# Patient Record
Sex: Female | Born: 1995 | Race: Black or African American | Hispanic: Yes | Marital: Single | Smoking: Former smoker
Health system: Southern US, Community
[De-identification: ages and names within clinical notes are randomized; demographics above are authoritative.]

## PROBLEM LIST (undated history)

## (undated) ENCOUNTER — Inpatient Hospital Stay (HOSPITAL_COMMUNITY): Payer: Self-pay

## (undated) DIAGNOSIS — B009 Herpesviral infection, unspecified: Secondary | ICD-10-CM

## (undated) DIAGNOSIS — O24419 Gestational diabetes mellitus in pregnancy, unspecified control: Secondary | ICD-10-CM

## (undated) HISTORY — PX: NO PAST SURGERIES: SHX2092

---
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
## 2017-02-23 DIAGNOSIS — B009 Herpesviral infection, unspecified: Secondary | ICD-10-CM

## 2017-02-23 HISTORY — DX: Herpesviral infection, unspecified: B00.9

## 2017-09-04 ENCOUNTER — Encounter (HOSPITAL_COMMUNITY): Payer: Self-pay

## 2017-09-04 ENCOUNTER — Emergency Department (HOSPITAL_COMMUNITY)
Admission: EM | Admit: 2017-09-04 | Discharge: 2017-09-04 | Disposition: A | Discharge status: Home / Self Care | Payer: Medicaid Other | Attending: Emergency Medicine | Admitting: Emergency Medicine

## 2017-09-04 DIAGNOSIS — Z3201 Encounter for pregnancy test, result positive: Secondary | ICD-10-CM | POA: Insufficient documentation

## 2017-09-04 DIAGNOSIS — Z3490 Encounter for supervision of normal pregnancy, unspecified, unspecified trimester: Secondary | ICD-10-CM

## 2017-09-04 DIAGNOSIS — Z79899 Other long term (current) drug therapy: Secondary | ICD-10-CM | POA: Insufficient documentation

## 2017-09-04 LAB — I-STAT BETA HCG BLOOD, ED (MC, WL, AP ONLY): I-stat hCG, quantitative: >2000 m[IU]/mL — ABNORMAL HIGH (ref <5)

## 2017-09-04 NOTE — ED Triage Notes (Signed)
Requesting pregnancy test.

## 2017-09-04 NOTE — ED Provider Notes (Signed)
[facility] EMERGENCY DEPARTMENT Provider Note   CSN: [id number] Arrival date & time: 09/04/17  1811     History   Chief Complaint Chief Complaint  Patient presents with  . Possible Pregnancy    HPI Kayla Liu is a 22 y.o. female.  22 year old female presents with request of pregnancy test.  Patient states she took a pregnancy test today that was positive and she is here to confirm this.  States her last menstrual cycle was May 25, denies any bleeding or pain.  No other complaints or concerns.     History reviewed. No pertinent past medical history.  There are no active problems to display for this patient.   History reviewed. No pertinent surgical history.   OB History   None      Home Medications    Prior to Admission medications   Medication Sig Start Date End Date Taking? Authorizing Provider  acetaminophen (TYLENOL) 500 MG tablet Take 1 tablet (500 mg total) by mouth every 6 (six) hours as needed. 12/09/16   Renita Papa, PA-C    Family History History reviewed. No pertinent family history.  Social History Social History   Tobacco Use  . Smoking status: Never Smoker  . Smokeless tobacco: Never Used  Substance Use Topics  . Alcohol use: No  . Drug use: No     Allergies   Patient has no known allergies.   Review of Systems Review of Systems  Constitutional: Negative for fever.  Gastrointestinal: Negative for abdominal pain.  Genitourinary: Negative for vaginal bleeding and vaginal discharge.  Psychiatric/Behavioral: Negative for confusion.  All other systems reviewed and are negative.    Physical Exam Updated Vital Signs BP (!) 106/50   Pulse (!) 101   Temp 98.4 F (36.9 C) (Oral)   Resp 16   LMP 07/05/2017   SpO2 100%   Physical Exam  Constitutional: She is oriented to person, place, and time. She appears well-developed and well-nourished. No distress.  HENT:  Head: Normocephalic and atraumatic.    Pulmonary/Chest: Effort normal.  Neurological: She is alert and oriented to person, place, and time.  Skin: She is not diaphoretic.  Psychiatric: She has a normal mood and affect. Her behavior is normal.  Nursing note and vitals reviewed.    ED Treatments / Results  Labs (all labs ordered are listed, but only abnormal results are displayed) Labs Reviewed  I-STAT BETA HCG BLOOD, ED ([facility], [facility], [facility] ONLY) - Abnormal; Notable for the following components:      Result Value   I-stat hCG, quantitative >2,000.0 (*)    All other components within normal limits    EKG None  Radiology No results found.  Procedures Procedures (including critical care time)  Medications Ordered in ED Medications - No data to display   Initial Impression / Assessment and Plan / ED Course  I have reviewed the triage vital signs and the nursing notes.  Pertinent labs & imaging results that were available during my care of the patient were reviewed by me and considered in my medical decision making (see chart for details).  Clinical Course as of Sep 05 1854  Sat Sep 04, 6472  4134 22 year old female in the emergency room for a pregnancy test, no other complaints.  Patient's pregnancy test is positive, advised her to follow-up with her PCP.  Given information on first trimester care.   [LM]    Clinical Course User Index [LM] Tacy Learn, PA-C  Final Clinical Impressions(s) / ED Diagnoses   Final diagnoses:  Pregnancy, unspecified gestational age    ED Discharge Orders    None       Roque Lias 09/04/17 1856    Carmin Muskrat, MD 09/04/17 2351

## 2017-09-04 NOTE — Discharge Instructions (Addendum)
Take a daily prenatal vitamin, these are available at any pharmacy. Call your primary care doctor for further evaluation and follow-up.

## 2017-09-04 NOTE — ED Notes (Signed)
Pt states that she is here for a pregnancy test, denies any pain. States her last period was in May and has had positive home pregnancy test.

## 2017-10-19 DIAGNOSIS — Z3490 Encounter for supervision of normal pregnancy, unspecified, unspecified trimester: Secondary | ICD-10-CM | POA: Insufficient documentation

## 2017-10-20 ENCOUNTER — Other Ambulatory Visit (HOSPITAL_COMMUNITY)
Admission: RE | Admit: 2017-10-20 | Discharge: 2017-10-20 | Disposition: A | Discharge status: Home / Self Care | Payer: Medicaid Other | Source: Ambulatory Visit | Attending: Obstetrics and Gynecology | Admitting: Obstetrics and Gynecology

## 2017-10-20 ENCOUNTER — Encounter: Payer: Self-pay | Admitting: Obstetrics and Gynecology

## 2017-10-20 ENCOUNTER — Ambulatory Visit (INDEPENDENT_AMBULATORY_CARE_PROVIDER_SITE_OTHER): Payer: Medicaid Other | Admitting: Obstetrics and Gynecology

## 2017-10-20 VITALS — BP 123/72 | HR 89 | Wt 145.3 lb

## 2017-10-20 DIAGNOSIS — Z23 Encounter for immunization: Secondary | ICD-10-CM | POA: Diagnosis not present

## 2017-10-20 DIAGNOSIS — Z3490 Encounter for supervision of normal pregnancy, unspecified, unspecified trimester: Secondary | ICD-10-CM

## 2017-10-20 DIAGNOSIS — Z3492 Encounter for supervision of normal pregnancy, unspecified, second trimester: Secondary | ICD-10-CM | POA: Insufficient documentation

## 2017-10-20 DIAGNOSIS — Z3402 Encounter for supervision of normal first pregnancy, second trimester: Secondary | ICD-10-CM

## 2017-10-20 NOTE — Progress Notes (Signed)
Subjective:  Kayla Liu is a 22 y.o. G1P0 at [redacted]w[redacted]d being seen today for her first OB visit. EDD by certain LMP. No chronic medical problems or medications.  She is currently monitored for the following issues for this low-risk pregnancy and has Supervision of normal pregnancy, antepartum on their problem list.  Patient reports no complaints.  Contractions: Irritability. Vag. Bleeding: None.   . Denies leaking of fluid.   The following portions of the patient's history were reviewed and updated as appropriate: allergies, current medications, past family history, past medical history, past social history, past surgical history and problem list. Problem list updated.  Objective:   Vitals:   10/20/17 1428  BP: 123/72  Pulse: 89  Weight: 145 lb 4.8 oz (65.9 kg)    Fetal Status: Fetal Heart Rate (bpm): 168         General:  Alert, oriented and cooperative. Patient is in no acute distress.  Skin: Skin is warm and dry. No rash noted.   Cardiovascular: Normal heart rate noted  Respiratory: Normal respiratory effort, no problems with respiration noted  Abdomen: Soft, gravid, appropriate for gestational age. Pain/Pressure: Absent     Pelvic:  Cervical exam performed        Extremities: Normal range of motion.  Edema: None  Mental Status: Normal mood and affect. Normal behavior. Normal judgment and thought content.   Urinalysis:      Assessment and Plan:  Pregnancy: G1P0 at [redacted]w[redacted]d  1. Encounter for supervision of normal pregnancy, antepartum, unspecified gravidity Prenatal care and labs reviewed with pt. Flu vaccine today Discussed Panorama, undecided presently  Preterm labor symptoms and general obstetric precautions including but not limited to vaginal bleeding, contractions, leaking of fluid and fetal movement were reviewed in detail with the patient. Please refer to After Visit Summary for other counseling recommendations.  Return in about 4 weeks (around 11/17/2017) for OB  visit.   Chancy Milroy, MD

## 2017-10-20 NOTE — Progress Notes (Signed)
Pt is here for initial OB visit. LMP 07/05/17. EDD 04/11/2018.

## 2017-10-20 NOTE — Patient Instructions (Signed)

## 2017-10-22 LAB — URINE CULTURE, OB REFLEX

## 2017-10-22 LAB — CERVICOVAGINAL ANCILLARY ONLY
CHLAMYDIA, DNA PROBE: NEGATIVE
Neisseria Gonorrhea: NEGATIVE

## 2017-10-22 LAB — CULTURE, OB URINE

## 2017-10-23 ENCOUNTER — Encounter (HOSPITAL_COMMUNITY): Payer: Self-pay | Admitting: Emergency Medicine

## 2017-10-23 ENCOUNTER — Emergency Department (HOSPITAL_COMMUNITY)
Admission: EM | Admit: 2017-10-23 | Discharge: 2017-10-24 | Discharge status: Against Medical Advice | Payer: Medicaid Other | Attending: Emergency Medicine | Admitting: Emergency Medicine

## 2017-10-23 DIAGNOSIS — Z5321 Procedure and treatment not carried out due to patient leaving prior to being seen by health care provider: Secondary | ICD-10-CM | POA: Diagnosis not present

## 2017-10-23 DIAGNOSIS — R109 Unspecified abdominal pain: Secondary | ICD-10-CM | POA: Diagnosis not present

## 2017-10-23 DIAGNOSIS — Z3A15 15 weeks gestation of pregnancy: Secondary | ICD-10-CM | POA: Diagnosis not present

## 2017-10-23 DIAGNOSIS — O9989 Other specified diseases and conditions complicating pregnancy, childbirth and the puerperium: Secondary | ICD-10-CM | POA: Diagnosis present

## 2017-10-23 LAB — COMPREHENSIVE METABOLIC PANEL
ALBUMIN: 3.9 g/dL (ref 3.5–5.0)
ALT: 30 U/L (ref 0–44)
AST: 31 U/L (ref 15–41)
Alkaline Phosphatase: 73 U/L (ref 38–126)
Anion gap: 9 (ref 5–15)
BUN: 9 mg/dL (ref 6–20)
CHLORIDE: 100 mmol/L (ref 98–111)
CO2: 23 mmol/L (ref 22–32)
CREATININE: 0.6 mg/dL (ref 0.44–1.00)
Calcium: 9.8 mg/dL (ref 8.9–10.3)
GFR calc Af Amer: >60 mL/min (ref >60)
GFR calc non Af Amer: >60 mL/min (ref >60)
GLUCOSE: 95 mg/dL (ref 70–99)
POTASSIUM: 3.8 mmol/L (ref 3.5–5.1)
Sodium: 132 mmol/L — ABNORMAL LOW (ref 135–145)
Total Bilirubin: 0.6 mg/dL (ref 0.3–1.2)
Total Protein: 7.5 g/dL (ref 6.5–8.1)

## 2017-10-23 LAB — CBC
HEMATOCRIT: 35.2 % — AB (ref 36.0–46.0)
Hemoglobin: 12 g/dL (ref 12.0–15.0)
MCH: 30.9 pg (ref 26.0–34.0)
MCHC: 34.1 g/dL (ref 30.0–36.0)
MCV: 90.7 fL (ref 78.0–100.0)
PLATELETS: 331 10*3/uL (ref 150–400)
RBC: 3.88 MIL/uL (ref 3.87–5.11)
RDW: 12.9 % (ref 11.5–15.5)
WBC: 7.7 10*3/uL (ref 4.0–10.5)

## 2017-10-23 LAB — URINALYSIS, ROUTINE W REFLEX MICROSCOPIC
BILIRUBIN URINE: NEGATIVE
Glucose, UA: NEGATIVE mg/dL
Ketones, ur: NEGATIVE mg/dL
LEUKOCYTES UA: NEGATIVE
Nitrite: NEGATIVE
PH: 6 (ref 5.0–8.0)
Protein, ur: NEGATIVE mg/dL
SPECIFIC GRAVITY, URINE: 1.024 (ref 1.005–1.030)

## 2017-10-23 LAB — LIPASE, BLOOD: LIPASE: 29 U/L (ref 11–51)

## 2017-10-23 LAB — HCG, QUANTITATIVE, PREGNANCY: HCG, BETA CHAIN, QUANT, S: 151262 m[IU]/mL — AB (ref <5)

## 2017-10-23 NOTE — ED Triage Notes (Signed)
Pt is [redacted] weeks pregnant and began to experience left sided abdominal pain that she describes as a stabbing and crushing pain.  Denies any other symptoms, hurts when she moves.  No apparent injuries

## 2017-10-24 NOTE — ED Notes (Signed)
Pt called for vitals signs recheck in waiting room no answer

## 2017-10-24 NOTE — ED Notes (Signed)
Called pt x2 for room, no response. 

## 2017-10-29 LAB — OBSTETRIC PANEL, INCLUDING HIV
Antibody Screen: NEGATIVE
Basophils Absolute: 0 10*3/uL (ref 0.0–0.2)
Basos: 1 %
EOS (ABSOLUTE): 0 10*3/uL (ref 0.0–0.4)
EOS: 0 %
HEMOGLOBIN: 12 g/dL (ref 11.1–15.9)
HEP B S AG: NEGATIVE
HIV Screen 4th Generation wRfx: NONREACTIVE
Hematocrit: 35.1 % (ref 34.0–46.6)
IMMATURE GRANS (ABS): 0 10*3/uL (ref 0.0–0.1)
IMMATURE GRANULOCYTES: 0 %
LYMPHS ABS: 2 10*3/uL (ref 0.7–3.1)
LYMPHS: 37 %
MCH: 30.3 pg (ref 26.6–33.0)
MCHC: 34.2 g/dL (ref 31.5–35.7)
MCV: 89 fL (ref 79–97)
Monocytes Absolute: 0.6 10*3/uL (ref 0.1–0.9)
Monocytes: 10 %
NEUTROS PCT: 52 %
Neutrophils Absolute: 2.9 10*3/uL (ref 1.4–7.0)
Platelets: 354 10*3/uL (ref 150–450)
RBC: 3.96 x10E6/uL (ref 3.77–5.28)
RDW: 13.1 % (ref 12.3–15.4)
RH TYPE: POSITIVE
RPR: NONREACTIVE
RUBELLA: 2.24 {index} (ref >0.99)
WBC: 5.5 10*3/uL (ref 3.4–10.8)

## 2017-10-29 LAB — HEMOGLOBINOPATHY EVALUATION
HEMOGLOBIN A2 QUANTITATION: 2.5 % (ref 1.8–3.2)
HGB C: 0 %
HGB S: 0 %
HGB VARIANT: 0 %
Hemoglobin F Quantitation: 0.9 % (ref 0.0–2.0)
Hgb A: 96.6 % (ref 96.4–98.8)

## 2017-10-29 LAB — CYSTIC FIBROSIS MUTATION 97: Interpretation: NOT DETECTED

## 2017-11-17 ENCOUNTER — Encounter: Payer: Medicaid Other | Admitting: Obstetrics and Gynecology

## 2017-11-22 ENCOUNTER — Encounter: Payer: Self-pay | Admitting: Obstetrics

## 2017-11-22 ENCOUNTER — Ambulatory Visit (INDEPENDENT_AMBULATORY_CARE_PROVIDER_SITE_OTHER): Payer: Medicaid Other | Admitting: Obstetrics and Gynecology

## 2017-11-22 ENCOUNTER — Encounter: Payer: Self-pay | Admitting: Obstetrics and Gynecology

## 2017-11-22 DIAGNOSIS — Z3490 Encounter for supervision of normal pregnancy, unspecified, unspecified trimester: Secondary | ICD-10-CM

## 2017-11-22 DIAGNOSIS — Z3481 Encounter for supervision of other normal pregnancy, first trimester: Secondary | ICD-10-CM | POA: Diagnosis not present

## 2017-11-22 NOTE — Progress Notes (Signed)
   PRENATAL VISIT NOTE  Subjective:  Kayla Liu is a 22 y.o. G1P0 at [redacted]w[redacted]d being seen today for ongoing prenatal care.  She is currently monitored for the following issues for this low-risk pregnancy and has Supervision of normal pregnancy, antepartum on their problem list.  Patient reports no complaints.  Contractions: Not present. Vag. Bleeding: None.  Movement: Present. Denies leaking of fluid.   The following portions of the patient's history were reviewed and updated as appropriate: allergies, current medications, past family history, past medical history, past social history, past surgical history and problem list. Problem list updated.  Objective:   Vitals:   11/22/17 1608  BP: 124/80  Pulse: 98  Weight: 142 lb 6.4 oz (64.6 kg)    Fetal Status: Fetal Heart Rate (bpm): 145   Movement: Present     General:  Alert, oriented and cooperative. Patient is in no acute distress.  Skin: Skin is warm and dry. No rash noted.   Cardiovascular: Normal heart rate noted  Respiratory: Normal respiratory effort, no problems with respiration noted  Abdomen: Soft, gravid, appropriate for gestational age.  Pain/Pressure: Absent     Pelvic: Cervical exam deferred        Extremities: Normal range of motion.  Edema: None  Mental Status: Normal mood and affect. Normal behavior. Normal judgment and thought content.   Assessment and Plan:  Pregnancy: G1P0 at [redacted]w[redacted]d  1. Encounter for supervision of normal pregnancy, antepartum, unspecified gravidity Patient is doing well without complaints Patient awaiting pregnancy medicaid Agrees to genetic screening today Discussed the need to schedule anatomy ultrasound (offered ultrasound with health department) - AFP, Serum, Open Spina Bifida - Genetic Screening  Preterm labor symptoms and general obstetric precautions including but not limited to vaginal bleeding, contractions, leaking of fluid and fetal movement were reviewed in detail with the  patient. Please refer to After Visit Summary for other counseling recommendations.  Return in about 4 weeks (around 12/20/2017) for ROB.  No future appointments.  Mora Bellman, MD

## 2017-11-22 NOTE — Progress Notes (Signed)
Pt is here for ROB G1P0 20w.

## 2017-11-24 LAB — AFP, SERUM, OPEN SPINA BIFIDA
AFP MOM: 1.02
AFP VALUE AFPOSL: 63.9 ng/mL
Gest. Age on Collection Date: 20 weeks
MATERNAL AGE AT EDD: 20.2 a
OSBR RISK 1 IN: 10000
TEST RESULTS AFP: NEGATIVE
WEIGHT: 142 [lb_av]

## 2017-11-25 ENCOUNTER — Encounter (HOSPITAL_COMMUNITY): Payer: Self-pay | Admitting: *Deleted

## 2017-11-25 ENCOUNTER — Inpatient Hospital Stay (HOSPITAL_COMMUNITY)
Admission: AD | Admit: 2017-11-25 | Discharge: 2017-11-25 | Disposition: A | Discharge status: Home / Self Care | Payer: Medicaid Other | Source: Ambulatory Visit | Attending: Obstetrics & Gynecology | Admitting: Obstetrics & Gynecology

## 2017-11-25 DIAGNOSIS — B373 Candidiasis of vulva and vagina: Secondary | ICD-10-CM | POA: Insufficient documentation

## 2017-11-25 DIAGNOSIS — Z8249 Family history of ischemic heart disease and other diseases of the circulatory system: Secondary | ICD-10-CM | POA: Diagnosis not present

## 2017-11-25 DIAGNOSIS — O98812 Other maternal infectious and parasitic diseases complicating pregnancy, second trimester: Secondary | ICD-10-CM | POA: Insufficient documentation

## 2017-11-25 DIAGNOSIS — Z79899 Other long term (current) drug therapy: Secondary | ICD-10-CM | POA: Diagnosis not present

## 2017-11-25 DIAGNOSIS — Z3A20 20 weeks gestation of pregnancy: Secondary | ICD-10-CM | POA: Diagnosis not present

## 2017-11-25 DIAGNOSIS — O4692 Antepartum hemorrhage, unspecified, second trimester: Secondary | ICD-10-CM | POA: Diagnosis present

## 2017-11-25 DIAGNOSIS — B3731 Acute candidiasis of vulva and vagina: Secondary | ICD-10-CM

## 2017-11-25 LAB — URINALYSIS, ROUTINE W REFLEX MICROSCOPIC
Bilirubin Urine: NEGATIVE
GLUCOSE, UA: NEGATIVE mg/dL
Hgb urine dipstick: NEGATIVE
KETONES UR: NEGATIVE mg/dL
Nitrite: NEGATIVE
Protein, ur: NEGATIVE mg/dL
Specific Gravity, Urine: 1.025 (ref 1.005–1.030)
pH: 5 (ref 5.0–8.0)

## 2017-11-25 LAB — WET PREP, GENITAL
Sperm: NONE SEEN
Trich, Wet Prep: NONE SEEN
Yeast Wet Prep HPF POC: NONE SEEN

## 2017-11-25 MED ORDER — TERCONAZOLE 0.4 % VA CREA: 1.0000 | TOPICAL_CREAM | Freq: Every day | VAGINAL | 0 refills | Status: DC

## 2017-11-25 NOTE — MAU Note (Signed)
Pt reports she had blood on tissue when she wiped earlier, some vaginal irritation.

## 2017-11-25 NOTE — MAU Provider Note (Signed)
History     CSN: [id number]  Arrival date and time: 11/25/17 1816   First Provider Initiated Contact with Patient 11/25/17 1855      Chief Complaint  Patient presents with  . Vaginal Bleeding  . vaginal irritation   Kayla Liu is a 22 y.o. G1P0 at [redacted]w[redacted]d who presents today with spotting, and vaginal irritation. Vaginal Bleeding  The patient's primary symptoms include genital itching and vaginal discharge. The patient's pertinent negatives include no pelvic pain. This is a new problem. The current episode started in the past 7 days. The problem occurs constantly. The problem has been unchanged. The patient is experiencing no pain. She is pregnant. Pertinent negatives include no chills, dysuria, fever, frequency, nausea, urgency or vomiting. The vaginal discharge was white. The vaginal bleeding is spotting (occasionally with wiping today ). She has not been passing clots. She has not been passing tissue. Nothing aggravates the symptoms. She has tried nothing for the symptoms. She is sexually active. Menstrual history: lmp 07/05/17     OB History    Gravida  1   Para      Term      Preterm      AB      Living        SAB      TAB      Ectopic      Multiple      Live Births              Past Medical History:  Diagnosis Date  . Medical history non-contributory     Past Surgical History:  Procedure Laterality Date  . NO PAST SURGERIES      Family History  Problem Relation Age of Onset  . Hypertension Maternal Aunt     Social History   Tobacco Use  . Smoking status: Never Smoker  . Smokeless tobacco: Never Used  Substance Use Topics  . Alcohol use: No  . Drug use: No    Allergies: No Known Allergies  Medications Prior to Admission  Medication Sig Dispense Refill Last Dose  . acetaminophen (TYLENOL) 500 MG tablet Take 1 tablet (500 mg total) by mouth every 6 (six) hours as needed. (Patient not taking: Reported on 11/22/2017) 30 tablet 0 Not  Taking  . Prenatal Vit-Fe Fumarate-FA (PRENATAL MULTIVITAMIN) TABS tablet Take 1 tablet by mouth daily at 12 noon.   Taking    Review of Systems  Constitutional: Negative for chills and fever.  Gastrointestinal: Negative for nausea and vomiting.  Genitourinary: Positive for vaginal bleeding and vaginal discharge. Negative for dysuria, frequency, pelvic pain and urgency.   Physical Exam   Blood pressure (!) 119/54, pulse 100, temperature 98.5 F (36.9 C), temperature source Oral, resp. rate 16, height 4\' 10"  (1.473 m), weight 64 kg, last menstrual period 07/05/2017, SpO2 100 %.  Physical Exam  Nursing note and vitals reviewed. Constitutional: She is oriented to person, place, and time. She appears well-developed and well-nourished. No distress.  HENT:  Head: Normocephalic.  Cardiovascular: Normal rate.  Respiratory: Effort normal.  GI: Soft. There is no tenderness. There is no rebound.  Genitourinary:  Genitourinary Comments:  External: no lesion Vagina: moderate amount of thick white clumpy discharge  Cervix: pink, smooth, some contact bleeding, closed/thick  Uterus: AGA   Neurological: She is alert and oriented to person, place, and time.  Skin: Skin is warm and dry.  Psychiatric: She has a normal mood and affect.  +FHT 157 with doppler  Results for orders placed or performed during the hospital encounter of 11/25/17 (from the past 24 hour(s))  Urinalysis, Routine w reflex microscopic     Status: Abnormal   Collection Time: 11/25/17  7:01 PM  Result Value Ref Range   Color, Urine YELLOW YELLOW   APPearance CLEAR CLEAR   Specific Gravity, Urine 1.025 1.005 - 1.030   pH 5.0 5.0 - 8.0   Glucose, UA NEGATIVE NEGATIVE mg/dL   Hgb urine dipstick NEGATIVE NEGATIVE   Bilirubin Urine NEGATIVE NEGATIVE   Ketones, ur NEGATIVE NEGATIVE mg/dL   Protein, ur NEGATIVE NEGATIVE mg/dL   Nitrite NEGATIVE NEGATIVE   Leukocytes, UA SMALL (A) NEGATIVE   RBC / HPF 0-5 0 - 5 RBC/hpf    WBC, UA 6-10 0 - 5 WBC/hpf   Bacteria, UA FEW (A) NONE SEEN   Squamous Epithelial / LPF 0-5 0 - 5   Mucus PRESENT   Wet prep, genital     Status: Abnormal   Collection Time: 11/25/17  7:03 PM  Result Value Ref Range   Yeast Wet Prep HPF POC NONE SEEN NONE SEEN   Trich, Wet Prep NONE SEEN NONE SEEN   Clue Cells Wet Prep HPF POC PRESENT (A) NONE SEEN   WBC, Wet Prep HPF POC MANY (A) NONE SEEN   Sperm NONE SEEN     MAU Course  Procedures  MDM   Assessment and Plan   1. Yeast infection involving the vagina and surrounding area   2. [redacted] weeks gestation of pregnancy    DC home Comfort measures reviewed  2nd Trimester precautions  Bleeding precautions PTL precautions  Fetal kick counts RX: terazol 7 as directed  Return to MAU as needed FU with OB as planned  Follow-up Information    [facility] Follow up.   Contact information: [address] [phone] [place] 11/25/2017, 7:04 PM

## 2017-11-25 NOTE — Discharge Instructions (Signed)

## 2017-11-26 LAB — GC/CHLAMYDIA PROBE AMP (~~LOC~~) NOT AT ARMC
Chlamydia: NEGATIVE
NEISSERIA GONORRHEA: NEGATIVE

## 2017-11-30 ENCOUNTER — Ambulatory Visit (HOSPITAL_COMMUNITY): Payer: Medicaid Other

## 2017-12-13 ENCOUNTER — Encounter (HOSPITAL_COMMUNITY): Payer: Self-pay

## 2017-12-17 ENCOUNTER — Telehealth: Payer: Self-pay | Admitting: Licensed Clinical Social Worker

## 2017-12-17 NOTE — Telephone Encounter (Signed)
Left message regarding scheduled appt

## 2017-12-20 ENCOUNTER — Ambulatory Visit (HOSPITAL_COMMUNITY): Payer: Medicaid Other

## 2017-12-20 ENCOUNTER — Encounter: Payer: Medicaid Other | Admitting: Obstetrics and Gynecology

## 2017-12-30 ENCOUNTER — Other Ambulatory Visit (HOSPITAL_COMMUNITY): Payer: Medicaid Other

## 2018-01-03 ENCOUNTER — Encounter: Payer: Medicaid Other | Admitting: Obstetrics and Gynecology

## 2018-01-03 ENCOUNTER — Encounter (HOSPITAL_COMMUNITY): Payer: Self-pay

## 2018-01-10 ENCOUNTER — Encounter: Payer: Self-pay | Admitting: Obstetrics and Gynecology

## 2018-01-10 ENCOUNTER — Ambulatory Visit (INDEPENDENT_AMBULATORY_CARE_PROVIDER_SITE_OTHER): Payer: Medicaid Other | Admitting: Obstetrics and Gynecology

## 2018-01-10 ENCOUNTER — Ambulatory Visit (HOSPITAL_COMMUNITY)
Admission: RE | Admit: 2018-01-10 | Discharge: 2018-01-10 | Disposition: A | Discharge status: Home / Self Care | Payer: Medicaid Other | Source: Ambulatory Visit | Attending: Obstetrics and Gynecology | Admitting: Obstetrics and Gynecology

## 2018-01-10 DIAGNOSIS — Z3490 Encounter for supervision of normal pregnancy, unspecified, unspecified trimester: Secondary | ICD-10-CM

## 2018-01-10 DIAGNOSIS — Z3A24 24 weeks gestation of pregnancy: Secondary | ICD-10-CM | POA: Insufficient documentation

## 2018-01-10 DIAGNOSIS — O3412 Maternal care for benign tumor of corpus uteri, second trimester: Secondary | ICD-10-CM

## 2018-01-10 DIAGNOSIS — O24410 Gestational diabetes mellitus in pregnancy, diet controlled: Secondary | ICD-10-CM

## 2018-01-10 DIAGNOSIS — Z3492 Encounter for supervision of normal pregnancy, unspecified, second trimester: Secondary | ICD-10-CM

## 2018-01-10 DIAGNOSIS — Z363 Encounter for antenatal screening for malformations: Secondary | ICD-10-CM | POA: Insufficient documentation

## 2018-01-10 DIAGNOSIS — O3410 Maternal care for benign tumor of corpus uteri, unspecified trimester: Secondary | ICD-10-CM | POA: Diagnosis not present

## 2018-01-10 DIAGNOSIS — Z23 Encounter for immunization: Secondary | ICD-10-CM

## 2018-01-10 NOTE — Progress Notes (Signed)
   PRENATAL VISIT NOTE  Subjective:  Kayla Liu is a 22 y.o. G1P0 at [redacted]w[redacted]d being seen today for ongoing prenatal care.  She is currently monitored for the following issues for this low-risk pregnancy and has Supervision of normal pregnancy, antepartum on their problem list.  Patient reports no complaints.  Contractions: Not present. Vag. Bleeding: None.  Movement: Present. Denies leaking of fluid.   The following portions of the patient's history were reviewed and updated as appropriate: allergies, current medications, past family history, past medical history, past social history, past surgical history and problem list. Problem list updated.  Objective:   Vitals:   01/10/18 0859  BP: 115/76  Pulse: (!) 105  Weight: 145 lb 4.8 oz (65.9 kg)    Fetal Status: Fetal Heart Rate (bpm): 148   Movement: Present     General:  Alert, oriented and cooperative. Patient is in no acute distress.  Skin: Skin is warm and dry. No rash noted.   Cardiovascular: Normal heart rate noted  Respiratory: Normal respiratory effort, no problems with respiration noted  Abdomen: Soft, gravid, appropriate for gestational age.  Pain/Pressure: Absent     Pelvic: Cervical exam deferred        Extremities: Normal range of motion.  Edema: None  Mental Status: Normal mood and affect. Normal behavior. Normal judgment and thought content.   Assessment and Plan:  Pregnancy: G1P0 at [redacted]w[redacted]d  1. Encounter for supervision of normal pregnancy, antepartum, unspecified gravidity Patient is doing well Undecided on pediatrician and contraception Third trimester labs todau with tdap Anatomy ultrasound today - Glucose Tolerance, 2 Hours w/1 Hour - CBC - RPR - HIV Antibody (routine testing w rflx)  Preterm labor symptoms and general obstetric precautions including but not limited to vaginal bleeding, contractions, leaking of fluid and fetal movement were reviewed in detail with the patient. Please refer to After Visit  Summary for other counseling recommendations.  Return in about 2 weeks (around 01/24/2018) for ROB, Low risk.  Future Appointments  Date Time Provider [place]  01/10/2018  2:15 PM [facility]-MFC [place] 4 [facility]-MFCUS M[facility]-US    Aradhana Gin, MD

## 2018-01-10 NOTE — Progress Notes (Signed)
Pt presents for ROB she feels "weak" c/o dizziness while getting out of bed. Tdap today.

## 2018-01-11 ENCOUNTER — Other Ambulatory Visit (HOSPITAL_COMMUNITY): Payer: Self-pay | Admitting: *Deleted

## 2018-01-11 DIAGNOSIS — Z362 Encounter for other antenatal screening follow-up: Secondary | ICD-10-CM

## 2018-01-11 LAB — GLUCOSE TOLERANCE, 2 HOURS W/ 1HR
Glucose, 1 hour: 120 mg/dL (ref 65–179)
Glucose, 2 hour: 112 mg/dL (ref 65–152)
Glucose, Fasting: 96 mg/dL — ABNORMAL HIGH (ref 65–91)

## 2018-01-11 LAB — CBC
HEMOGLOBIN: 10.9 g/dL — AB (ref 11.1–15.9)
Hematocrit: 31.1 % — ABNORMAL LOW (ref 34.0–46.6)
MCH: 31.7 pg (ref 26.6–33.0)
MCHC: 35 g/dL (ref 31.5–35.7)
MCV: 90 fL (ref 79–97)
Platelets: 283 10*3/uL (ref 150–450)
RBC: 3.44 x10E6/uL — ABNORMAL LOW (ref 3.77–5.28)
RDW: 13 % (ref 12.3–15.4)
WBC: 8.2 10*3/uL (ref 3.4–10.8)

## 2018-01-11 LAB — HIV ANTIBODY (ROUTINE TESTING W REFLEX): HIV Screen 4th Generation wRfx: NONREACTIVE

## 2018-01-11 LAB — RPR: RPR Ser Ql: NONREACTIVE

## 2018-01-12 ENCOUNTER — Other Ambulatory Visit: Payer: Self-pay | Admitting: *Deleted

## 2018-01-12 ENCOUNTER — Encounter (HOSPITAL_COMMUNITY): Payer: Self-pay

## 2018-01-12 ENCOUNTER — Inpatient Hospital Stay (HOSPITAL_COMMUNITY)
Admission: AD | Admit: 2018-01-12 | Discharge: 2018-01-12 | Disposition: A | Discharge status: Home / Self Care | Payer: Medicaid Other | Source: Ambulatory Visit | Attending: Obstetrics & Gynecology | Admitting: Obstetrics & Gynecology

## 2018-01-12 DIAGNOSIS — O4692 Antepartum hemorrhage, unspecified, second trimester: Secondary | ICD-10-CM | POA: Diagnosis not present

## 2018-01-12 DIAGNOSIS — L080 Pyoderma: Secondary | ICD-10-CM

## 2018-01-12 DIAGNOSIS — Z3A27 27 weeks gestation of pregnancy: Secondary | ICD-10-CM | POA: Diagnosis not present

## 2018-01-12 DIAGNOSIS — O24419 Gestational diabetes mellitus in pregnancy, unspecified control: Secondary | ICD-10-CM | POA: Insufficient documentation

## 2018-01-12 DIAGNOSIS — L989 Disorder of the skin and subcutaneous tissue, unspecified: Secondary | ICD-10-CM

## 2018-01-12 LAB — URINALYSIS, ROUTINE W REFLEX MICROSCOPIC
GLUCOSE, UA: NEGATIVE mg/dL
HGB URINE DIPSTICK: NEGATIVE
Ketones, ur: NEGATIVE mg/dL
NITRITE: NEGATIVE
PH: 5 (ref 5.0–8.0)
Protein, ur: 30 mg/dL — AB
SPECIFIC GRAVITY, URINE: 1.023 (ref 1.005–1.030)
Squamous Epithelial / LPF: >50 — ABNORMAL HIGH (ref 0–5)
WBC, UA: >50 WBC/hpf — ABNORMAL HIGH (ref 0–5)

## 2018-01-12 LAB — WET PREP, GENITAL
SPERM: NONE SEEN
Trich, Wet Prep: NONE SEEN
Yeast Wet Prep HPF POC: NONE SEEN

## 2018-01-12 MED ORDER — VALACYCLOVIR HCL 500 MG PO TABS: ORAL_TABLET | ORAL | 2 refills | Status: DC

## 2018-01-12 MED ORDER — ACCU-CHEK NANO SMARTVIEW W/DEVICE KIT: 1.0000 | PACK | 0 refills | Status: DC

## 2018-01-12 MED ORDER — ACCU-CHEK FASTCLIX LANCETS MISC: 1.0000 [IU] | Freq: Four times a day (QID) | 12 refills | Status: DC

## 2018-01-12 MED ORDER — GLUCOSE BLOOD VI STRP: ORAL_STRIP | 12 refills | Status: DC

## 2018-01-12 NOTE — Progress Notes (Signed)
Encounter error

## 2018-01-12 NOTE — MAU Note (Signed)
Pt reports vaginal spotting that started tonight. States she went to the bathroom and noticed on tissue. No pad. Pt reports vaginal odor but denies itching or discharge. Pt denies pain. Pt denies recent intercourse. Reports good fetal movement.

## 2018-01-12 NOTE — Discharge Instructions (Signed)
Genital Herpes  (WE DO NOT KNOW FOR SURE THAT THIS IS WHAT YOU HAVE) Genital herpes is a common sexually transmitted infection (STI) that is caused by a virus. The virus spreads from person to person through sexual contact. Infection can cause itching, blisters, and sores around the genitals or rectum. Symptoms may last several days and then go away This is called an outbreak. However, the virus remains in your body, so you may have more outbreaks in the future. The time between outbreaks varies and can be months or years. Genital herpes affects men and women. It is particularly concerning for pregnant women because the virus can be passed to the baby during delivery and can cause serious problems. Genital herpes is also a concern for people who have a weak disease-fighting (immune) system. What are the causes? This condition is caused by the herpes simplex virus (HSV) type 1 or type 2. The virus may spread through:  Sexual contact with an infected person, including vaginal, anal, and oral sex.  Contact with fluid from a herpes sore.  The skin. This means that you can get herpes from an infected partner even if he or she does not have a visible sore or does not know that he or she is infected.  What increases the risk? You are more likely to develop this condition if:  You have sex with many partners.  You do not use latex condoms during sex.  What are the signs or symptoms? Most people do not have symptoms (asymptomatic) or have mild symptoms that may be mistaken for other skin problems. Symptoms may include:  Small red bumps near the genitals, rectum, or mouth. These bumps turn into blisters and then turn into sores.  Flu-like symptoms, including: ? Fever. ? Body aches. ? Swollen lymph nodes. ? Headache.  Painful urination.  Pain and itching in the genital area or rectal area.  Vaginal discharge.  Tingling or shooting pain in the legs and buttocks.  Generally, symptoms are  more severe and last longer during the first (primary) outbreak. Flu-like symptoms are also more common during the primary outbreak. How is this diagnosed? Genital herpes may be diagnosed based on:  A physical exam.  Your medical history.  Blood tests.  A test of a fluid sample (culture) from an open sore.  How is this treated? There is no cure for this condition, but treatment with antiviral medicines that are taken by mouth (orally) can do the following:  Speed up healing and relieve symptoms.  Help to reduce the spread of the virus to sexual partners.  Limit the chance of future outbreaks, or make future outbreaks shorter.  Lessen symptoms of future outbreaks.  Your health care provider may also recommend pain relief medicines, such as aspirin or ibuprofen. Follow these instructions at home: Sexual activity  Do not have sexual contact during active outbreaks.  Practice safe sex. Latex condoms and female condoms may help prevent the spread of the herpes virus. General instructions  Keep the affected areas dry and clean.  Take over-the-counter and prescription medicines only as told by your health care provider.  Avoid rubbing or touching blisters and sores. If you do touch blisters or sores: ? Wash your hands thoroughly with soap and water. ? Do not touch your eyes afterward.  To help relieve pain or itching, you may take the following actions as directed by your health care provider: ? Apply a cold, wet cloth (cold compress) to affected areas 4-6 times a  day. ? Apply a substance that protects your skin and reduces bleeding (astringent). ? Apply a gel that helps relieve pain around sores (lidocaine gel). ? Take a warm, shallow bath that cleans the genital area (sitz bath).  Keep all follow-up visits as told by your health care provider. This is important. How is this prevented?  Use condoms. Although anyone can get genital herpes during sexual contact, even with the  use of a condom, a condom can provide some protection.  Avoid having multiple sexual partners.  Talk with your sexual partner about any symptoms either of you may have. Also, talk with your partner about any history of STIs.  Get tested for STIs before you have sex. Ask your partner to do the same.  Do not have sexual contact if you have symptoms of genital herpes. Contact a health care provider if:  Your symptoms are not improving with medicine.  Your symptoms return.  You have new symptoms.  You have a fever.  You have abdominal pain.  You have redness, swelling, or pain in your eye.  You notice new sores on other parts of your body.  You are a woman and experience bleeding between menstrual periods.  You have had herpes and you become pregnant or plan to become pregnant. Summary  Genital herpes is a common sexually transmitted infection (STI) that is caused by the herpes simplex virus (HSV) type 1 or type 2.  These viruses are most often spread through sexual contact with an infected person.  You are more likely to develop this condition if you have sex with many partners or you have unprotected sex.  Most people do not have symptoms (asymptomatic) or have mild symptoms that may be mistaken for other skin problems. Symptoms occur as outbreaks that may happen months or years apart.  There is no cure for this condition, but treatment with oral antiviral medicines can reduce symptoms, reduce the chance of spreading the virus to a partner, prevent future outbreaks, or shorten future outbreaks. This information is not intended to replace advice given to you by your health care provider. Make sure you discuss any questions you have with your health care provider. Document Released: 02/07/2000 Document Revised: 01/10/2016 Document Reviewed: 01/10/2016 Elsevier Interactive Patient Education  [employer].

## 2018-01-12 NOTE — Progress Notes (Signed)
Pt is scheduled for follow up

## 2018-01-12 NOTE — Addendum Note (Signed)
Addended by: Mora Bellman on: 01/12/2018 01:23 PM   Modules accepted: Orders

## 2018-01-12 NOTE — MAU Provider Note (Signed)
Chief Complaint:  Vaginal Bleeding   First Provider Initiated Contact with Patient 01/12/18 2233     HPI: Kayla Liu is a 22 y.o. G1P0 at 87w2dho presents to maternity admissions reporting seeing small amount of blood on tissue tonight.  Has some "bumps" on labia, tender to touch. . She reports good fetal movement, denies LOF, urinary symptoms, h/a, dizziness, n/v, diarrhea, constipation or fever/chills.   Vaginal Bleeding  The patient's primary symptoms include genital lesions, vaginal bleeding and vaginal discharge. The patient's pertinent negatives include no genital itching, genital odor or pelvic pain. This is a new problem. The current episode started today. The problem occurs intermittently. The problem has been unchanged. She is pregnant. Pertinent negatives include no abdominal pain, back pain, chills, fever, headaches, nausea or vomiting. The vaginal discharge was bloody. The vaginal bleeding is spotting. She has not been passing clots. She has not been passing tissue. Nothing aggravates the symptoms. She has tried nothing for the symptoms.    RN Note: Pt reports vaginal spotting that started tonight. States she went to the bathroom and noticed on tissue. No pad. Pt reports vaginal odor but denies itching or discharge. Pt denies pain. Pt denies recent intercourse. Reports good fetal movement.  Past Medical History: Past Medical History:  Diagnosis Date  . Medical history non-contributory     Past obstetric history: OB History  Gravida Para Term Preterm AB Living  1            SAB TAB Ectopic Multiple Live Births               # Outcome Date GA Lbr Len/2nd Weight Sex Delivery Anes PTL Lv  1 Current             Past Surgical History: Past Surgical History:  Procedure Laterality Date  . NO PAST SURGERIES      Family History: Family History  Problem Relation Age of Onset  . Hypertension Maternal Aunt     Social History: Social History   Tobacco Use  .  Smoking status: Never Smoker  . Smokeless tobacco: Never Used  Substance Use Topics  . Alcohol use: No  . Drug use: No    Allergies: No Known Allergies  Meds:  Medications Prior to Admission  Medication Sig Dispense Refill Last Dose  . Prenatal Vit-Fe Fumarate-FA (PRENATAL MULTIVITAMIN) TABS tablet Take 1 tablet by mouth daily at 12 noon.   01/12/2018 at Unknown time  . ACCU-CHEK FASTCLIX LANCETS MISC 1 Units by Percutaneous route 4 (four) times daily. 100 each 12   . acetaminophen (TYLENOL) 500 MG tablet Take 1 tablet (500 mg total) by mouth every 6 (six) hours as needed. 30 tablet 0 Taking  . Blood Glucose Monitoring Suppl (ACCU-CHEK NANO SMARTVIEW) w/Device KIT 1 kit by Subdermal route as directed. Check blood sugars for fasting, and two hours after breakfast, lunch and dinner (4 checks daily) 1 kit 0   . glucose blood (ACCU-CHEK SMARTVIEW) test strip Use as instructed to check blood sugars 100 each 12   . terconazole (TERAZOL 7) 0.4 % vaginal cream Place 1 applicator vaginally at bedtime. (Patient not taking: Reported on 01/10/2018) 45 g 0 Not Taking    I have reviewed patient's Past Medical Hx, Surgical Hx, Family Hx, Social Hx, medications and allergies.   ROS:  Review of Systems  Constitutional: Negative for chills and fever.  Gastrointestinal: Negative for abdominal pain, nausea and vomiting.  Genitourinary: Positive for vaginal bleeding and vaginal discharge. Negative  for pelvic pain.  Musculoskeletal: Negative for back pain.  Neurological: Negative for headaches.   Other systems negative  Physical Exam   Patient Vitals for the past 24 hrs:  BP Temp Pulse Resp SpO2 Height Weight  01/12/18 2208 (!) 113/58 99.1 F (37.3 C) (!) 112 16 97 % - -  01/12/18 2201 - - - - - 4' 11" (1.499 m) 64.4 kg   Constitutional: Well-developed, well-nourished female in no acute distress.  Cardiovascular: normal rate and rhythm Respiratory: normal effort, clear to auscultation  bilaterally GI: Abd soft, non-tender, gravid appropriate for gestational age.   No rebound or guarding. MS: Extremities nontender, no edema, normal ROM Neurologic: Alert and oriented x 4.  GU: Neg CVAT.  PELVIC EXAM: Cervix pink, visually closed, without lesion, scant white creamy discharge, vaginal walls and external genitalia have scattered papular lesions, one near cervix, one on left vaginal wall that is quite friable and bleeding, several on labia and perineum.  They almost look pustular, like folliculitis, but are quite tender to touch which makes me suspect herpetic       FHT:  Baseline 140 , moderate variability, accelerations present, no decelerations Contractions:   Rare   Labs: Results for orders placed or performed during the hospital encounter of 01/12/18 (from the past 24 hour(s))  Urinalysis, Routine w reflex microscopic     Status: Abnormal   Collection Time: 01/12/18 10:21 PM  Result Value Ref Range   Color, Urine AMBER (A) YELLOW   APPearance CLOUDY (A) CLEAR   Specific Gravity, Urine 1.023 1.005 - 1.030   pH 5.0 5.0 - 8.0   Glucose, UA NEGATIVE NEGATIVE mg/dL   Hgb urine dipstick NEGATIVE NEGATIVE   Bilirubin Urine MODERATE (A) NEGATIVE   Ketones, ur NEGATIVE NEGATIVE mg/dL   Protein, ur 30 (A) NEGATIVE mg/dL   Nitrite NEGATIVE NEGATIVE   Leukocytes, UA MODERATE (A) NEGATIVE   RBC / HPF 11-20 0 - 5 RBC/hpf   WBC, UA >50 (H) 0 - 5 WBC/hpf   Bacteria, UA RARE (A) NONE SEEN   Squamous Epithelial / LPF >50 (H) 0 - 5   Mucus PRESENT   Wet prep, genital     Status: Abnormal   Collection Time: 01/12/18 10:46 PM  Result Value Ref Range   Yeast Wet Prep HPF POC NONE SEEN NONE SEEN   Trich, Wet Prep NONE SEEN NONE SEEN   Clue Cells Wet Prep HPF POC PRESENT (A) NONE SEEN   WBC, Wet Prep HPF POC MODERATE (A) NONE SEEN   Sperm NONE SEEN    A/Positive/-- (08/28 1520)  Imaging:  none  MAU Course/MDM: I have ordered labs and reviewed results.   I sent a HSV  culture of lesions.  I also sent HSV2 IgG and IgM to determine if this is a primary outbreak or not.  It will also verify if these are herpetic, in case the culture does not grow.  (unable to find order for HSV2 IgM alone, so had to order as torch titer)  I think the one vaginal lesion which was very friable, is the source of the bleeding.  Cervix was long and closed and not friable. . There was a lesion next to cervix, so when examining in labor, need to do spec exam as well as just external exam NST reviewed   Treatments in MAU included none.    Assessment: Single intrauterine pregnancy at 85w3dAntepartum bleeding, second trimester - Plan: Discharge patient  Perineal irritation in  female  Plan: Discharge home Supportive care to lesions Rx Valtrex for treatment until we find culture results. Preterm Labor precautions and fetal kick counts Follow up in Office for prenatal visits and recheck of status next week  Encouraged to return here or to other Urgent Care/ED if she develops worsening of symptoms, increase in pain, fever, or other concerning symptoms.   Pt stable at time of discharge.  Hansel Feinstein CNM, MSN Certified Nurse-Midwife 01/12/2018 11:30 PM

## 2018-01-13 ENCOUNTER — Inpatient Hospital Stay (HOSPITAL_COMMUNITY)
Admission: AD | Admit: 2018-01-13 | Discharge: 2018-01-13 | Disposition: A | Discharge status: Home / Self Care | Payer: Medicaid Other | Source: Ambulatory Visit | Attending: Obstetrics & Gynecology | Admitting: Obstetrics & Gynecology

## 2018-01-13 ENCOUNTER — Other Ambulatory Visit: Payer: Self-pay

## 2018-01-13 ENCOUNTER — Telehealth: Payer: Self-pay | Admitting: *Deleted

## 2018-01-13 ENCOUNTER — Encounter (HOSPITAL_COMMUNITY): Payer: Self-pay | Admitting: *Deleted

## 2018-01-13 DIAGNOSIS — Z3A27 27 weeks gestation of pregnancy: Secondary | ICD-10-CM | POA: Diagnosis not present

## 2018-01-13 DIAGNOSIS — O26892 Other specified pregnancy related conditions, second trimester: Secondary | ICD-10-CM | POA: Diagnosis not present

## 2018-01-13 DIAGNOSIS — J029 Acute pharyngitis, unspecified: Secondary | ICD-10-CM | POA: Diagnosis present

## 2018-01-13 LAB — GC/CHLAMYDIA PROBE AMP (~~LOC~~) NOT AT ARMC
CHLAMYDIA, DNA PROBE: NEGATIVE
NEISSERIA GONORRHEA: NEGATIVE

## 2018-01-13 LAB — GROUP A STREP BY PCR: Group A Strep by PCR: NOT DETECTED

## 2018-01-13 MED ORDER — PENICILLIN V POTASSIUM 500 MG PO TABS: 500.0000 mg | ORAL_TABLET | Freq: Two times a day (BID) | ORAL | 0 refills | Status: AC

## 2018-01-13 NOTE — Discharge Instructions (Signed)

## 2018-01-13 NOTE — MAU Note (Signed)
Past couple of days her throat has been sore.  When brushing her teeth, noted that her throat was covered in white stuff.  Hurts to swallow, glands in throat are swollen.  Mentioned it last night, but nobody checked it out.  Denies fever or cough.

## 2018-01-13 NOTE — Telephone Encounter (Signed)
Received a voice message stating she is calling about her results she saw on MyChart - states she did not understand and would like a call. Per chart is [facility] patient - will route to their office.

## 2018-01-13 NOTE — MAU Provider Note (Signed)
Chief Complaint: Sore Throat   First Provider Initiated Contact with Patient 01/13/18 1000     SUBJECTIVE HPI: Kayla Liu is a 22 y.o. G1P0 at 28w3dwho presents to Maternity Admissions reporting sore throat. Symptoms began 2 days ago. Pain worse with swallowing. Denies fever/chills, cough, body aches. Has noticed swollen glands in her throat.   Location: throat Quality: burning, sharp Severity: 7/10 on pain scale Duration: 2 days Timing: constant Modifying factors: worse with swallowing. Has tried to treat with hot tea without relief Associated signs and symptoms: none  Past Medical History:  Diagnosis Date  . Medical history non-contributory    OB History  Gravida Para Term Preterm AB Living  1            SAB TAB Ectopic Multiple Live Births               # Outcome Date GA Lbr Len/2nd Weight Sex Delivery Anes PTL Lv  1 Current            Past Surgical History:  Procedure Laterality Date  . NO PAST SURGERIES     Social History   Socioeconomic History  . Marital status: Single    Spouse name: Not on file  . Number of children: Not on file  . Years of education: Not on file  . Highest education level: Not on file  Occupational History  . Not on file  Social Needs  . Financial resource strain: Not on file  . Food insecurity:    Worry: Not on file    Inability: Not on file  . Transportation needs:    Medical: Not on file    Non-medical: Not on file  Tobacco Use  . Smoking status: Never Smoker  . Smokeless tobacco: Never Used  Substance and Sexual Activity  . Alcohol use: No  . Drug use: No  . Sexual activity: Yes  Lifestyle  . Physical activity:    Days per week: Not on file    Minutes per session: Not on file  . Stress: Not on file  Relationships  . Social connections:    Talks on phone: Not on file    Gets together: Not on file    Attends religious service: Not on file    Active member of club or organization: Not on file    Attends meetings of  clubs or organizations: Not on file    Relationship status: Not on file  . Intimate partner violence:    Fear of current or ex partner: Not on file    Emotionally abused: Not on file    Physically abused: Not on file    Forced sexual activity: Not on file  Other Topics Concern  . Not on file  Social History Narrative  . Not on file   Family History  Problem Relation Age of Onset  . Hypertension Maternal Aunt    No current facility-administered medications on file prior to encounter.    Current Outpatient Medications on File Prior to Encounter  Medication Sig Dispense Refill  . ACCU-CHEK FASTCLIX LANCETS MISC 1 Units by Percutaneous route 4 (four) times daily. 100 each 12  . acetaminophen (TYLENOL) 500 MG tablet Take 1 tablet (500 mg total) by mouth every 6 (six) hours as needed. 30 tablet 0  . Blood Glucose Monitoring Suppl (ACCU-CHEK NANO SMARTVIEW) w/Device KIT 1 kit by Subdermal route as directed. Check blood sugars for fasting, and two hours after breakfast, lunch and dinner (4 checks daily)  1 kit 0  . glucose blood (ACCU-CHEK SMARTVIEW) test strip Use as instructed to check blood sugars 100 each 12  . Prenatal Vit-Fe Fumarate-FA (PRENATAL MULTIVITAMIN) TABS tablet Take 1 tablet by mouth daily at 12 noon.    Marland Kitchen terconazole (TERAZOL 7) 0.4 % vaginal cream Place 1 applicator vaginally at bedtime. (Patient not taking: Reported on 01/10/2018) 45 g 0  . valACYclovir (VALTREX) 500 MG tablet Take two tablets by mouth twice daily for ten days; then one tablet twice daily for remainder of pregnancy 180 tablet 2   No Known Allergies  I have reviewed patient's Past Medical Hx, Surgical Hx, Family Hx, Social Hx, medications and allergies.   Review of Systems  Constitutional: Negative.   HENT: Positive for sore throat. Negative for ear pain, mouth sores, sinus pain, sneezing and trouble swallowing.   Respiratory: Negative for cough.   Gastrointestinal: Negative.   Genitourinary: Negative.    Musculoskeletal: Negative for myalgias.    OBJECTIVE Patient Vitals for the past 24 hrs:  BP Temp Temp src Pulse Resp SpO2 Weight  01/13/18 0953 102/60 98.6 F (37 C) Oral 96 16 98 % 64.3 kg   Constitutional: Well-developed, well-nourished female in no acute distress.  Respiratory: normal rate and effort. HENT: oropharyngeal erythema & exudate present. No tonsillar abscess Lymph: submandibular lymphadenopathy  Neurologic: Alert and oriented x 4.    LAB RESULTS Results for orders placed or performed during the hospital encounter of 01/12/18 (from the past 24 hour(s))  Urinalysis, Routine w reflex microscopic     Status: Abnormal   Collection Time: 01/12/18 10:21 PM  Result Value Ref Range   Color, Urine AMBER (A) YELLOW   APPearance CLOUDY (A) CLEAR   Specific Gravity, Urine 1.023 1.005 - 1.030   pH 5.0 5.0 - 8.0   Glucose, UA NEGATIVE NEGATIVE mg/dL   Hgb urine dipstick NEGATIVE NEGATIVE   Bilirubin Urine MODERATE (A) NEGATIVE   Ketones, ur NEGATIVE NEGATIVE mg/dL   Protein, ur 30 (A) NEGATIVE mg/dL   Nitrite NEGATIVE NEGATIVE   Leukocytes, UA MODERATE (A) NEGATIVE   RBC / HPF 11-20 0 - 5 RBC/hpf   WBC, UA >50 (H) 0 - 5 WBC/hpf   Bacteria, UA RARE (A) NONE SEEN   Squamous Epithelial / LPF >50 (H) 0 - 5   Mucus PRESENT   Wet prep, genital     Status: Abnormal   Collection Time: 01/12/18 10:46 PM  Result Value Ref Range   Yeast Wet Prep HPF POC NONE SEEN NONE SEEN   Trich, Wet Prep NONE SEEN NONE SEEN   Clue Cells Wet Prep HPF POC PRESENT (A) NONE SEEN   WBC, Wet Prep HPF POC MODERATE (A) NONE SEEN   Sperm NONE SEEN     IMAGING No results found.  MAU COURSE Orders Placed This Encounter  Procedures  . Group A Strep by PCR   No orders of the defined types were placed in this encounter.   MDM VSS. Pt afebrile Strep swab collected & pending. Will tx for suspected strep pharyngitis based on exam.  ASSESSMENT 1. Acute pharyngitis, unspecified etiology      PLAN Discharge home in stable condition. Rx PCN Strep swab pending Discussed symptomatic tx  Allergies as of 01/13/2018   No Known Allergies     Medication List    STOP taking these medications   terconazole 0.4 % vaginal cream Commonly known as:  TERAZOL 7     TAKE these medications   ACCU-CHEK  FASTCLIX LANCETS Misc 1 Units by Percutaneous route 4 (four) times daily.   ACCU-CHEK NANO SMARTVIEW w/Device Kit 1 kit by Subdermal route as directed. Check blood sugars for fasting, and two hours after breakfast, lunch and dinner (4 checks daily)   acetaminophen 500 MG tablet Commonly known as:  TYLENOL Take 1 tablet (500 mg total) by mouth every 6 (six) hours as needed.   glucose blood test strip Use as instructed to check blood sugars   penicillin v potassium 500 MG tablet Commonly known as:  VEETID Take 1 tablet (500 mg total) by mouth 2 (two) times daily for 10 days.   prenatal multivitamin Tabs tablet Take 1 tablet by mouth daily at 12 noon.   valACYclovir 500 MG tablet Commonly known as:  VALTREX Take two tablets by mouth twice daily for ten days; then one tablet twice daily for remainder of pregnancy        Jorje Guild, NP 01/13/2018  10:06 AM

## 2018-01-14 LAB — HSV 2 ANTIBODY, IGG: HSV 2 Glycoprotein G Ab, IgG: <0.91 index (ref 0.00–0.90)

## 2018-01-14 LAB — CULTURE, OB URINE: Culture: <10000 — AB

## 2018-01-15 LAB — TORCH-IGM(TOXO/ RUB/ CMV/ HSV) W TITER
Rubella IgM: <20 AU/mL (ref 0.0–19.9)
Toxoplasma Antibody- IgM: <3 AU/mL (ref 0.0–7.9)

## 2018-01-15 LAB — INFECT DISEASE AB IGM REFLEX 1

## 2018-01-16 LAB — HSV CULTURE AND TYPING

## 2018-01-17 ENCOUNTER — Telehealth (HOSPITAL_COMMUNITY): Payer: Self-pay | Admitting: *Deleted

## 2018-01-17 NOTE — Telephone Encounter (Signed)
Pt had called inquiring about lab results.  No labs performed in MFM.  Returned call to patient, recommended that she call her OB office for results.

## 2018-01-25 ENCOUNTER — Encounter: Payer: Medicaid Other | Admitting: Obstetrics

## 2018-01-26 ENCOUNTER — Other Ambulatory Visit: Payer: Self-pay

## 2018-01-26 ENCOUNTER — Encounter (HOSPITAL_COMMUNITY): Payer: Self-pay

## 2018-01-26 ENCOUNTER — Inpatient Hospital Stay (HOSPITAL_COMMUNITY)
Admission: AD | Admit: 2018-01-26 | Discharge: 2018-01-26 | Disposition: A | Discharge status: Home / Self Care | Payer: Medicaid Other | Source: Ambulatory Visit | Attending: Obstetrics and Gynecology | Admitting: Obstetrics and Gynecology

## 2018-01-26 DIAGNOSIS — O4703 False labor before 37 completed weeks of gestation, third trimester: Secondary | ICD-10-CM | POA: Diagnosis not present

## 2018-01-26 DIAGNOSIS — O24419 Gestational diabetes mellitus in pregnancy, unspecified control: Secondary | ICD-10-CM | POA: Insufficient documentation

## 2018-01-26 DIAGNOSIS — Z3A29 29 weeks gestation of pregnancy: Secondary | ICD-10-CM

## 2018-01-26 DIAGNOSIS — O26893 Other specified pregnancy related conditions, third trimester: Secondary | ICD-10-CM | POA: Insufficient documentation

## 2018-01-26 DIAGNOSIS — R109 Unspecified abdominal pain: Secondary | ICD-10-CM | POA: Diagnosis present

## 2018-01-26 HISTORY — DX: Gestational diabetes mellitus in pregnancy, unspecified control: O24.419

## 2018-01-26 HISTORY — DX: Herpesviral infection, unspecified: B00.9

## 2018-01-26 LAB — URINALYSIS, ROUTINE W REFLEX MICROSCOPIC
Bilirubin Urine: NEGATIVE
GLUCOSE, UA: NEGATIVE mg/dL
HGB URINE DIPSTICK: NEGATIVE
Ketones, ur: NEGATIVE mg/dL
Leukocytes, UA: NEGATIVE
Nitrite: NEGATIVE
PH: 5 (ref 5.0–8.0)
PROTEIN: NEGATIVE mg/dL
Specific Gravity, Urine: 1.01 (ref 1.005–1.030)

## 2018-01-26 LAB — WET PREP, GENITAL
SPERM: NONE SEEN
Trich, Wet Prep: NONE SEEN
Yeast Wet Prep HPF POC: NONE SEEN

## 2018-01-26 MED ORDER — LACTATED RINGERS IV BOLUS
1000.0000 mL | Freq: Once | INTRAVENOUS | Status: AC
  Administered 2018-01-26: 1000 mL via INTRAVENOUS

## 2018-01-26 MED ORDER — NIFEDIPINE 10 MG PO CAPS
10.0000 mg | ORAL_CAPSULE | ORAL | Status: AC
  Administered 2018-01-26 (×2): 10 mg via ORAL
  Filled 2018-01-26 (×2): qty 1

## 2018-01-26 NOTE — MAU Provider Note (Signed)
History     CSN: [id number]  Arrival date and time: 01/26/18 1018   First Provider Initiated Contact with Patient 01/26/18 1052      Chief Complaint  Patient presents with  . Back Pain   Kayla Liu is a 22 y.o.G1P0 at 46w2dwho presents today with sudden onset abdominal pain. She states that about 1 hour PTA she had 10/10 lower abdominal pain. She states that the pain has resolved now, and rates her pain 0/10. She denies VB or LOF. She reports normal fetal movement. [facility] Recently diagnosed with GDM.   Pelvic Pain  The patient's primary symptoms include pelvic pain. This is a new problem. The current episode started today. The problem occurs constantly. The problem has been resolved. The patient is experiencing no pain (when the pain occurred she states it was 10/10 ). She is pregnant. Pertinent negatives include no chills, fever, nausea or vomiting. The vaginal discharge was normal. There has been no bleeding. Nothing aggravates the symptoms. She has tried nothing for the symptoms. Sexual activity: denies intercourse in the last 24 hours     OB History    Gravida  1   Para      Term      Preterm      AB      Living        SAB      TAB      Ectopic      Multiple      Live Births              Past Medical History:  Diagnosis Date  . Gestational diabetes   . HSV (herpes simplex virus) infection 2019  . Medical history non-contributory     Past Surgical History:  Procedure Laterality Date  . NO PAST SURGERIES      Family History  Problem Relation Age of Onset  . Hypertension Maternal Aunt     Social History   Tobacco Use  . Smoking status: Never Smoker  . Smokeless tobacco: Never Used  Substance Use Topics  . Alcohol use: No  . Drug use: No    Allergies: No Known Allergies  Medications Prior to Admission  Medication Sig Dispense Refill Last Dose  . ACCU-CHEK FASTCLIX LANCETS MISC 1 Units by  Percutaneous route 4 (four) times daily. 100 each 12   . acetaminophen (TYLENOL) 500 MG tablet Take 1 tablet (500 mg total) by mouth every 6 (six) hours as needed. 30 tablet 0 Taking  . Blood Glucose Monitoring Suppl (ACCU-CHEK NANO SMARTVIEW) w/Device KIT 1 kit by Subdermal route as directed. Check blood sugars for fasting, and two hours after breakfast, lunch and dinner (4 checks daily) 1 kit 0   . glucose blood (ACCU-CHEK SMARTVIEW) test strip Use as instructed to check blood sugars 100 each 12   . Prenatal Vit-Fe Fumarate-FA (PRENATAL MULTIVITAMIN) TABS tablet Take 1 tablet by mouth daily at 12 noon.   01/12/2018 at Unknown time  . valACYclovir (VALTREX) 500 MG tablet Take two tablets by mouth twice daily for ten days; then one tablet twice daily for remainder of pregnancy 180 tablet 2     Review of Systems  Constitutional: Negative for chills and fever.  Gastrointestinal: Negative for nausea and vomiting.  Genitourinary: Positive for pelvic pain.   Physical Exam   Blood pressure 114/61, pulse 77, temperature (!) 97.5 F (36.4 C), temperature source Oral, last menstrual period 07/05/2017, SpO2 100 %.  Physical Exam  Nursing note and vitals reviewed. Constitutional: She is oriented to person, place, and time. She appears well-developed and well-nourished. No distress.  HENT:  Head: Normocephalic.  Cardiovascular: Normal rate.  Respiratory: Effort normal.  GI: Soft. There is no tenderness. There is no rebound.  Genitourinary:  Genitourinary Comments: Cervix: closed/thick   Neurological: She is alert and oriented to person, place, and time.  Skin: Skin is warm and dry.  Psychiatric: She has a normal mood and affect.    NST:  Baseline: 140 Variability: moderate Accels: 10x10 Decels: none Toco: irritability   Results for orders placed or performed during the hospital encounter of 01/26/18 (from the past 24 hour(s))  Urinalysis, Routine w reflex microscopic     Status: Abnormal    Collection Time: 01/26/18 10:36 AM  Result Value Ref Range   Color, Urine YELLOW YELLOW   APPearance HAZY (A) CLEAR   Specific Gravity, Urine 1.010 1.005 - 1.030   pH 5.0 5.0 - 8.0   Glucose, UA NEGATIVE NEGATIVE mg/dL   Hgb urine dipstick NEGATIVE NEGATIVE   Bilirubin Urine NEGATIVE NEGATIVE   Ketones, ur NEGATIVE NEGATIVE mg/dL   Protein, ur NEGATIVE NEGATIVE mg/dL   Nitrite NEGATIVE NEGATIVE   Leukocytes, UA NEGATIVE NEGATIVE  Wet prep, genital     Status: Abnormal   Collection Time: 01/26/18 11:03 AM  Result Value Ref Range   Yeast Wet Prep HPF POC NONE SEEN NONE SEEN   Trich, Wet Prep NONE SEEN NONE SEEN   Clue Cells Wet Prep HPF POC PRESENT (A) NONE SEEN   WBC, Wet Prep HPF POC FEW (A) NONE SEEN   Sperm NONE SEEN     MAU Course  Procedures  MDM Patient has had 1L or LR and procardia UI has stopped. Patient denies any return of her pain.   Assessment and Plan   1. Threatened premature labor in third trimester   2. [redacted] weeks gestation of pregnancy    DC home Comfort measures reviewed  3rd Trimester precautions  PTL precautions  Fetal kick counts RX: none  Return to MAU as needed FU with OB as planned  Follow-up Information    [facility] Follow up.   Contact information: [address] [place] [id number] [place] 01/26/2018, 10:54 AM

## 2018-01-26 NOTE — Discharge Instructions (Signed)

## 2018-01-26 NOTE — MAU Note (Signed)
Pt arrived via EMS.  Pt states she had some sharp lower back pain that started around 0900 this am that she rates 4/10.  Pt denies vag bleeding or LOF.  Pt reports good fetal movement.

## 2018-01-27 LAB — GC/CHLAMYDIA PROBE AMP (~~LOC~~) NOT AT ARMC
CHLAMYDIA, DNA PROBE: NEGATIVE
Neisseria Gonorrhea: NEGATIVE

## 2018-02-02 ENCOUNTER — Ambulatory Visit: Payer: Medicaid Other | Admitting: Registered"

## 2018-02-07 ENCOUNTER — Ambulatory Visit (HOSPITAL_COMMUNITY)
Admission: RE | Admit: 2018-02-07 | Discharge: 2018-02-07 | Disposition: A | Discharge status: Home / Self Care | Payer: Medicaid Other | Source: Ambulatory Visit | Attending: Obstetrics and Gynecology | Admitting: Obstetrics and Gynecology

## 2018-02-07 ENCOUNTER — Encounter: Payer: Self-pay | Admitting: Obstetrics

## 2018-02-07 ENCOUNTER — Ambulatory Visit (INDEPENDENT_AMBULATORY_CARE_PROVIDER_SITE_OTHER): Payer: Medicaid Other | Admitting: Obstetrics

## 2018-02-07 VITALS — BP 107/72 | HR 67 | Wt 141.8 lb

## 2018-02-07 DIAGNOSIS — Z362 Encounter for other antenatal screening follow-up: Secondary | ICD-10-CM | POA: Diagnosis not present

## 2018-02-07 DIAGNOSIS — O0990 Supervision of high risk pregnancy, unspecified, unspecified trimester: Secondary | ICD-10-CM

## 2018-02-07 DIAGNOSIS — O3413 Maternal care for benign tumor of corpus uteri, third trimester: Secondary | ICD-10-CM | POA: Diagnosis not present

## 2018-02-07 DIAGNOSIS — O0993 Supervision of high risk pregnancy, unspecified, third trimester: Secondary | ICD-10-CM

## 2018-02-07 DIAGNOSIS — Z3A28 28 weeks gestation of pregnancy: Secondary | ICD-10-CM | POA: Insufficient documentation

## 2018-02-07 DIAGNOSIS — O24410 Gestational diabetes mellitus in pregnancy, diet controlled: Secondary | ICD-10-CM

## 2018-02-07 NOTE — Progress Notes (Signed)
Pt is here for ROB. G1P0 [redacted]w[redacted]d.

## 2018-02-07 NOTE — Progress Notes (Signed)
Subjective:  Kayla Liu is a 22 y.o. G1P0 at [redacted]w[redacted]d being seen today for ongoing prenatal care.  She is currently monitored for the following issues for this high-risk pregnancy and has Supervision of normal pregnancy, antepartum and Gestational diabetes mellitus (GDM), antepartum on their problem list.  Patient reports heartburn.  Contractions: Irritability. Vag. Bleeding: None.  Movement: Present. Denies leaking of fluid.   The following portions of the patient's history were reviewed and updated as appropriate: allergies, current medications, past family history, past medical history, past social history, past surgical history and problem list. Problem list updated.  Objective:   Vitals:   02/07/18 0956  BP: 107/72  Pulse: 67  Weight: 141 lb 12.8 oz (64.3 kg)    Fetal Status:     Movement: Present     General:  Alert, oriented and cooperative. Patient is in no acute distress.  Skin: Skin is warm and dry. No rash noted.   Cardiovascular: Normal heart rate noted  Respiratory: Normal respiratory effort, no problems with respiration noted  Abdomen: Soft, gravid, appropriate for gestational age. Pain/Pressure: Absent     Pelvic:  Cervical exam deferred        Extremities: Normal range of motion.  Edema: None  Mental Status: Normal mood and affect. Normal behavior. Normal judgment and thought content.   Urinalysis:      Assessment and Plan:  Pregnancy: G1P0 at [redacted]w[redacted]d  1. Supervision of high risk pregnancy, antepartum  2. Diet controlled gestational diabetes mellitus (GDM), antepartum   Preterm labor symptoms and general obstetric precautions including but not limited to vaginal bleeding, contractions, leaking of fluid and fetal movement were reviewed in detail with the patient. Please refer to After Visit Summary for other counseling recommendations.  Return in about 2 weeks (around 02/21/2018) for ROB.   Shelly Bombard, MD

## 2018-02-24 ENCOUNTER — Ambulatory Visit (INDEPENDENT_AMBULATORY_CARE_PROVIDER_SITE_OTHER): Payer: Medicaid Other | Admitting: Obstetrics

## 2018-02-24 ENCOUNTER — Encounter: Payer: Self-pay | Admitting: Obstetrics

## 2018-02-24 VITALS — BP 115/75 | HR 72 | Wt 146.0 lb

## 2018-02-24 DIAGNOSIS — Z3493 Encounter for supervision of normal pregnancy, unspecified, third trimester: Secondary | ICD-10-CM

## 2018-02-24 DIAGNOSIS — O24410 Gestational diabetes mellitus in pregnancy, diet controlled: Secondary | ICD-10-CM

## 2018-02-24 DIAGNOSIS — Z3490 Encounter for supervision of normal pregnancy, unspecified, unspecified trimester: Secondary | ICD-10-CM

## 2018-02-24 NOTE — Progress Notes (Signed)
Subjective:  Kayla Liu is a 23 y.o. G1P0 at [redacted]w[redacted]d being seen today for ongoing prenatal care.  She is currently monitored for the following issues for this high-risk pregnancy and has Supervision of normal pregnancy, antepartum and Gestational diabetes mellitus (GDM), antepartum on their problem list.  Patient reports no complaints.  Contractions: Not present. Vag. Bleeding: None.  Movement: Present. Denies leaking of fluid.   The following portions of the patient's history were reviewed and updated as appropriate: allergies, current medications, past family history, past medical history, past social history, past surgical history and problem list. Problem list updated.  Objective:   Vitals:   02/24/18 1007  BP: 115/75  Pulse: 72  Weight: 146 lb (66.2 kg)    Fetal Status:     Movement: Present     General:  Alert, oriented and cooperative. Patient is in no acute distress.  Skin: Skin is warm and dry. No rash noted.   Cardiovascular: Normal heart rate noted  Respiratory: Normal respiratory effort, no problems with respiration noted  Abdomen: Soft, gravid, appropriate for gestational age. Pain/Pressure: Present     Pelvic:  Cervical exam deferred        Extremities: Normal range of motion.     Mental Status: Normal mood and affect. Normal behavior. Normal judgment and thought content.   Urinalysis:      Assessment and Plan:  Pregnancy: G1P0 at [redacted]w[redacted]d  1. Encounter for supervision of normal pregnancy, antepartum, unspecified gravidity   2. Diet controlled gestational diabetes mellitus (GDM), antepartum  Preterm labor symptoms and general obstetric precautions including but not limited to vaginal bleeding, contractions, leaking of fluid and fetal movement were reviewed in detail with the patient. Please refer to After Visit Summary for other counseling recommendations.  No follow-ups on file.   Shelly Bombard, MD

## 2018-03-10 ENCOUNTER — Encounter: Payer: Medicaid Other | Admitting: Obstetrics

## 2018-03-23 ENCOUNTER — Encounter: Payer: Medicaid Other | Admitting: Obstetrics

## 2018-03-25 ENCOUNTER — Telehealth: Payer: Self-pay | Admitting: *Deleted

## 2018-03-25 NOTE — Telephone Encounter (Signed)
Called patient to reschedule appointment and no answer and unable to leave a message.Marland KitchenMarland Kitchen

## 2018-03-29 ENCOUNTER — Encounter: Payer: Self-pay | Admitting: Obstetrics

## 2018-03-29 ENCOUNTER — Ambulatory Visit (INDEPENDENT_AMBULATORY_CARE_PROVIDER_SITE_OTHER): Payer: Medicaid Other | Admitting: Obstetrics

## 2018-03-29 VITALS — BP 114/77 | HR 76 | Wt 153.7 lb

## 2018-03-29 DIAGNOSIS — O0993 Supervision of high risk pregnancy, unspecified, third trimester: Secondary | ICD-10-CM

## 2018-03-29 DIAGNOSIS — O0990 Supervision of high risk pregnancy, unspecified, unspecified trimester: Secondary | ICD-10-CM

## 2018-03-29 DIAGNOSIS — O24410 Gestational diabetes mellitus in pregnancy, diet controlled: Secondary | ICD-10-CM

## 2018-03-29 NOTE — Progress Notes (Addendum)
Subjective:  Kayla Liu is a 23 y.o. G1P0 at [redacted]w[redacted]d being seen today for ongoing prenatal care.  She is currently monitored for the following issues for this high-risk pregnancy and has Supervision of normal pregnancy, antepartum and Gestational diabetes mellitus (GDM), antepartum on their problem list.  Patient reports no complaints.  Contractions: Irritability. Vag. Bleeding: None.  Movement: Present. Denies leaking of fluid.   The following portions of the patient's history were reviewed and updated as appropriate: allergies, current medications, past family history, past medical history, past social history, past surgical history and problem list. Problem list updated.  Objective:   Vitals:   03/29/18 1547  BP: 114/77  Pulse: 76  Weight: 153 lb 11.2 oz (69.7 kg)    Fetal Status:     Movement: Present     General:  Alert, oriented and cooperative. Patient is in no acute distress.  Skin: Skin is warm and dry. No rash noted.   Cardiovascular: Normal heart rate noted  Respiratory: Normal respiratory effort, no problems with respiration noted  Abdomen: Soft, gravid, appropriate for gestational age. Pain/Pressure: Present     Pelvic:  Cervical exam deferred        Extremities: Normal range of motion.  Edema: None  Mental Status: Normal mood and affect. Normal behavior. Normal judgment and thought content.   Urinalysis:     [place] MFM OB COMP + 14 WK (Accession [id number]) (Order [id number])  Imaging  Date: 01/10/2018 Department: [place] Released By: Claudius Sis Authorizing: Chancy Milroy, MD  Exam Information   Status Exam Begun  Exam Ended   Final [99] 01/10/2018 2:36 PM 01/10/2018 3:54 PM  PACS Images   Show images for [place] MFM OB COMP + 14 WK  Study Result   ----------------------------------------------------------------------  OBSTETRICS REPORT                       (Signed Final 01/10/2018 04:50  pm) ---------------------------------------------------------------------- Patient Info  ID #:       [id number]                          D.O.B.:  04-23-1995 (22 yrs)  Name:       Kayla Liu                Visit Date: 01/10/2018 02:39 pm ---------------------------------------------------------------------- Performed By  Performed By:     Jeanene Erb BS,      Secondary Phy.:    [facility] -24.2  [facility]  Attending:        Sander Nephew      Address:           [address]                                                              [place]                                                              [place]  Referred By:      Vickii Chafe                  Location:          [facility]                    CONSTANT MD  Ref. Address:     Faculty ---------------------------------------------------------------------- Orders   #  Description                          Code         Ordered By   1  [place] MFM OB COMP + 14 WK               [id number]     Arlina Robes  ----------------------------------------------------------------------   #  Order #                    Accession #                 Episode #   1  [id number]                  [id number]                  [id number]  ---------------------------------------------------------------------- Indications   Encounter for antenatal screening for          Z36.3   malformations   Uterine fibroids                               O34.10   [redacted] weeks gestation of pregnancy  Z3A.24   ---------------------------------------------------------------------- Vital Signs  Weight (lb): 145                               Height:        4'10"  BMI:         30.3 ---------------------------------------------------------------------- Fetal Evaluation  Num Of Fetuses:          1  Fetal Heart Rate(bpm):   159  Cardiac Activity:        Observed  Presentation:            Breech  Placenta:                Anterior  P. Cord Insertion:       Visualized  Amniotic Fluid  AFI FV:      Within normal limits                              Largest Pocket(cm)                              8.0 ---------------------------------------------------------------------- Biometry  BPD:      60.4  mm     G. Age:  24w 4d         55  %    CI:        74.98   %    70 - 86                                                          FL/HC:       18.9  %    18.7 - 20.9  HC:      221.3  mm     G. Age:  24w 1d         27  %    HC/AC:       1.10       1.05 - 1.21  AC:      201.7  mm     G. Age:  24w 5d         57  %    FL/BPD:      69.2  %    71 - 87  FL:       41.8  mm     G. Age:  23w 4d         19  %    FL/AC:       20.7  %    20 - 24  Est. FW:     680   gm     1 lb 8 oz     52  % ---------------------------------------------------------------------- OB History  Gravidity:    1 ---------------------------------------------------------------------- Gestational Age  LMP:           27w 0d        Date:  07/05/17                 EDD:   04/11/18  U/S Today:     24w 2d  EDD:   04/30/18  Best:          24w 2d     Det. By:  U/S (01/10/18)           EDD:   04/30/18 ---------------------------------------------------------------------- Anatomy  Cranium:               Appears normal         Aortic Arch:            Not well visualized  Cavum:                 Appears normal         Ductal Arch:            Not well visualized  Ventricles:            Appears normal         Diaphragm:               Appears normal  Choroid Plexus:        Appears normal         Stomach:                Appears normal, left                                                                        sided  Cerebellum:            Appears normal         Abdomen:                Appears normal  Posterior Fossa:       Appears normal         Abdominal Wall:         Not well visualized  Nuchal Fold:           Not applicable (>73    Cord Vessels:           Appears normal ([redacted]                         wks GA)                                        vessel cord)  Face:                  Orbits appear          Kidneys:                Appear normal                         normal  Lips:                  Appears normal         Bladder:                Appears normal  Thoracic:              Appears normal         Spine:  Not well visualized  Heart:                 Not well visualized    Upper Extremities:      Appears normal  RVOT:                  Not well visualized    Lower Extremities:      Appears normal  LVOT:                  Not well visualized  Other:  Female gender. Heels and 5th digit visualized. Technically difficult due          to fetal position. ---------------------------------------------------------------------- Cervix Uterus Adnexa  Cervix  Length:            3.4  cm.  Normal appearance by transabdominal scan. ---------------------------------------------------------------------- Myomas   Site                     L(cm)      W(cm)      D(cm)       Location   Anterior                 3.7        1.7        3.2  ----------------------------------------------------------------------   Blood Flow                 RI        PI       Comments  ---------------------------------------------------------------------- Impression  Normal interval growth.  No ultrasonic evidence of structural  fetal anomalies.  Suboptimal views of the fetal anatomy seen due to fetal  position, (i.e. heart, hands, spine  and anterior abdominal wall)  Pregnancy is dated by today's examination ---------------------------------------------------------------------- Recommendations  Follow up 4 weeks to complete the anatomy. ----------------------------------------------------------------------               Sander Nephew, MD Electronically Signed Final Report   01/10/2018 04:50 pm ----------------------------------------------------------------------     Assessment and Plan:  Pregnancy: G1P0 at [redacted]w[redacted]d  1. Supervision of high risk pregnancy, antepartum  2. Diet controlled gestational diabetes mellitus (GDM), antepartum - good glucose control   Term labor symptoms and general obstetric precautions including but not limited to vaginal bleeding, contractions, leaking of fluid and fetal movement were reviewed in detail with the patient. Please refer to After Visit Summary for other counseling recommendations.  Return in about 1 week (around 04/05/2018) for [place].   Shelly Bombard, MD

## 2018-03-30 ENCOUNTER — Other Ambulatory Visit: Payer: Self-pay | Admitting: Obstetrics

## 2018-04-05 ENCOUNTER — Encounter (HOSPITAL_COMMUNITY): Payer: Self-pay | Admitting: *Deleted

## 2018-04-05 ENCOUNTER — Encounter: Payer: Medicaid Other | Admitting: Obstetrics

## 2018-04-05 ENCOUNTER — Inpatient Hospital Stay (HOSPITAL_COMMUNITY)
Admission: AD | Admit: 2018-04-05 | Discharge: 2018-04-05 | Disposition: A | Discharge status: Home / Self Care | Payer: Medicaid Other | Attending: Obstetrics and Gynecology | Admitting: Obstetrics and Gynecology

## 2018-04-05 DIAGNOSIS — O4703 False labor before 37 completed weeks of gestation, third trimester: Secondary | ICD-10-CM | POA: Diagnosis not present

## 2018-04-05 DIAGNOSIS — O98313 Other infections with a predominantly sexual mode of transmission complicating pregnancy, third trimester: Secondary | ICD-10-CM | POA: Insufficient documentation

## 2018-04-05 DIAGNOSIS — O24410 Gestational diabetes mellitus in pregnancy, diet controlled: Secondary | ICD-10-CM

## 2018-04-05 DIAGNOSIS — Z3A36 36 weeks gestation of pregnancy: Secondary | ICD-10-CM | POA: Insufficient documentation

## 2018-04-05 DIAGNOSIS — A6000 Herpesviral infection of urogenital system, unspecified: Secondary | ICD-10-CM | POA: Insufficient documentation

## 2018-04-05 MED ORDER — VALACYCLOVIR HCL 500 MG PO TABS
500.0000 mg | ORAL_TABLET | Freq: Two times a day (BID) | ORAL | Status: DC
  Filled 2018-04-05: qty 1

## 2018-04-05 MED ORDER — VALACYCLOVIR HCL 500 MG PO TABS: ORAL_TABLET | ORAL | 2 refills | Status: DC

## 2018-04-05 NOTE — MAU Note (Signed)
PT SAYS UC - STRONG  X45 MIN.    SROM AT  [place].     NO VE .  HAS AN APPOINTMENT   TODAY .  HAS HX-HSV- LAST OUTBREAK 2 MTHS AG- TAKES VALTREX.  DENIES  ANY S/S OF AN OUTBREAK NOW.   DENES MRSA.   GBS- NEG

## 2018-04-05 NOTE — Discharge Instructions (Signed)

## 2018-04-05 NOTE — MAU Provider Note (Signed)
First Provider Initiated Contact with Patient 04/05/18 0631       S: Ms. Rabiah Goeser is a 23 y.o. G1P0 at [redacted]w[redacted]d  who presents to MAU today complaining contractions q 3  minutes since 0530. She denies vaginal bleeding. She endorses LOF. She reports normal fetal movement.  She denies sexual intercourse  O: LMP 07/05/2017 (Exact Date)  GENERAL: Well-developed, well-nourished female in no acute distress.  HEAD: Normocephalic, atraumatic.  CHEST: Normal effort of breathing, regular heart rate ABDOMEN: Soft, nontender, gravid  Cervical exam:  Dilation: Closed Effacement (%): Thick Cervical Position: Posterior Exam by:: SAM, CNM   Fetal Monitoring: Baseline: 135 Variability: moderate Accelerations: positive 15 x 15 Decelerations: none Contractions: mild, q 2-5 minutes   A: SIUP at [redacted]w[redacted]d  False labor  Intact amniotic sac, Negative pooling, Negative fern HSV lesion near cervix, also on patient's left thigh  P: Discharge home in stable condition Rx Valacyclovir for episodic treatment + suppression for remainder of pregnancy  F/U: Pt has appt at [facility] this afternoon  Darlina Rumpf, [place] 04/05/2018 7:13 AM

## 2018-04-06 ENCOUNTER — Ambulatory Visit (INDEPENDENT_AMBULATORY_CARE_PROVIDER_SITE_OTHER): Payer: Medicaid Other | Admitting: Obstetrics

## 2018-04-06 ENCOUNTER — Other Ambulatory Visit (HOSPITAL_COMMUNITY)
Admission: RE | Admit: 2018-04-06 | Discharge: 2018-04-06 | Disposition: A | Discharge status: Home / Self Care | Payer: Medicaid Other | Source: Ambulatory Visit | Attending: Obstetrics | Admitting: Obstetrics

## 2018-04-06 VITALS — BP 109/66 | HR 66 | Wt 151.0 lb

## 2018-04-06 DIAGNOSIS — Z3A36 36 weeks gestation of pregnancy: Secondary | ICD-10-CM

## 2018-04-06 DIAGNOSIS — O0990 Supervision of high risk pregnancy, unspecified, unspecified trimester: Secondary | ICD-10-CM

## 2018-04-06 DIAGNOSIS — O0993 Supervision of high risk pregnancy, unspecified, third trimester: Secondary | ICD-10-CM

## 2018-04-06 NOTE — Progress Notes (Signed)
Pt had recent MAU visit for contractions.

## 2018-04-07 ENCOUNTER — Encounter: Payer: Self-pay | Admitting: Obstetrics

## 2018-04-07 LAB — GC/CHLAMYDIA PROBE AMP (~~LOC~~) NOT AT ARMC
CHLAMYDIA, DNA PROBE: NEGATIVE
NEISSERIA GONORRHEA: NEGATIVE

## 2018-04-07 NOTE — Progress Notes (Signed)
Subjective:  Kayla Liu is a 23 y.o. G1P0 at [redacted]w[redacted]d being seen today for ongoing prenatal care.  She is currently monitored for the following issues for this high-risk pregnancy and has Supervision of normal pregnancy, antepartum and Gestational diabetes mellitus (GDM), antepartum on their problem list.  Patient reports occasional contractions.  Contractions: Not present. Vag. Bleeding: None.  Movement: Present. Denies leaking of fluid.   The following portions of the patient's history were reviewed and updated as appropriate: allergies, current medications, past family history, past medical history, past social history, past surgical history and problem list. Problem list updated.  Objective:   Vitals:   04/06/18 1544  BP: 109/66  Pulse: 66  Weight: 151 lb (68.5 kg)    Fetal Status:     Movement: Present     General:  Alert, oriented and cooperative. Patient is in no acute distress.  Skin: Skin is warm and dry. No rash noted.   Cardiovascular: Normal heart rate noted  Respiratory: Normal respiratory effort, no problems with respiration noted  Abdomen: Soft, gravid, appropriate for gestational age. Pain/Pressure: Absent     Pelvic:  Cervical exam deferred        Extremities: Normal range of motion.  Edema: Trace  Mental Status: Normal mood and affect. Normal behavior. Normal judgment and thought content.   Urinalysis:      Assessment and Plan:  Pregnancy: G1P0 at [redacted]w[redacted]d  1. Supervision of high risk pregnancy, antepartum Rx: - Strep Gp B NAA - GC/Chlamydia probe amp ([place])not at [facility]  Preterm labor symptoms and general obstetric precautions including but not limited to vaginal bleeding, contractions, leaking of fluid and fetal movement were reviewed in detail with the patient. Please refer to After Visit Summary for other counseling recommendations.  Return in about 1 week (around 04/13/2018) for [place].   Shelly Bombard, MD

## 2018-04-08 LAB — STREP GP B NAA: STREP GROUP B AG: NEGATIVE

## 2018-04-09 ENCOUNTER — Encounter: Payer: Self-pay | Admitting: Obstetrics and Gynecology

## 2018-04-09 DIAGNOSIS — A609 Anogenital herpesviral infection, unspecified: Secondary | ICD-10-CM | POA: Insufficient documentation

## 2018-04-13 ENCOUNTER — Encounter: Payer: Medicaid Other | Admitting: Obstetrics & Gynecology

## 2018-04-14 ENCOUNTER — Ambulatory Visit (INDEPENDENT_AMBULATORY_CARE_PROVIDER_SITE_OTHER): Payer: Medicaid Other | Admitting: Certified Nurse Midwife

## 2018-04-14 VITALS — BP 116/76 | HR 78 | Wt 153.4 lb

## 2018-04-14 DIAGNOSIS — O24410 Gestational diabetes mellitus in pregnancy, diet controlled: Secondary | ICD-10-CM | POA: Diagnosis not present

## 2018-04-14 DIAGNOSIS — Z3490 Encounter for supervision of normal pregnancy, unspecified, unspecified trimester: Secondary | ICD-10-CM

## 2018-04-14 DIAGNOSIS — Z3493 Encounter for supervision of normal pregnancy, unspecified, third trimester: Secondary | ICD-10-CM

## 2018-04-14 DIAGNOSIS — O98313 Other infections with a predominantly sexual mode of transmission complicating pregnancy, third trimester: Secondary | ICD-10-CM

## 2018-04-14 DIAGNOSIS — A609 Anogenital herpesviral infection, unspecified: Secondary | ICD-10-CM

## 2018-04-14 LAB — POCT CBG (FASTING - GLUCOSE)-MANUAL ENTRY: Glucose Fasting, POC: 92 mg/dL (ref 70–99)

## 2018-04-14 MED ORDER — ACCU-CHEK FASTCLIX LANCETS MISC: 1.0000 [IU] | Freq: Four times a day (QID) | 0 refills | Status: DC

## 2018-04-14 NOTE — Progress Notes (Signed)
Patient reports fetal movement with irregular contractions. 

## 2018-04-15 ENCOUNTER — Encounter (HOSPITAL_COMMUNITY): Payer: Self-pay

## 2018-04-15 NOTE — Progress Notes (Signed)
   PRENATAL VISIT NOTE  Subjective:  Kayla Liu is a 23 y.o. G1P0 at [redacted]w[redacted]d being seen today for ongoing prenatal care.  She is currently monitored for the following issues for this low-risk pregnancy and has Supervision of normal pregnancy, antepartum; Gestational diabetes mellitus (GDM), antepartum; and HSV (herpes simplex virus) anogenital infection on their problem list.  Patient reports occasional contractions.  Contractions: Irregular. Vag. Bleeding: None.  Movement: Present. Denies leaking of fluid.   The following portions of the patient's history were reviewed and updated as appropriate: allergies, current medications, past family history, past medical history, past social history, past surgical history and problem list. Problem list updated.  Objective:  Vitals:   04/14/18 1459  BP: 116/76  Pulse: 78  Weight: 153 lb 6.4 oz (69.6 kg)    Fetal Status: Fetal Heart Rate (bpm): 135   Movement: Present     General:  Alert, oriented and cooperative. Patient is in no acute distress.  Skin: Skin is warm and dry. No rash noted.   Cardiovascular: Normal heart rate noted  Respiratory: Normal respiratory effort, no problems with respiration noted  Abdomen: Soft, gravid, appropriate for gestational age.  Pain/Pressure: Absent     Pelvic: Cervical exam deferred        Extremities: Normal range of motion.  Edema: Trace  Mental Status: Normal mood and affect. Normal behavior. Normal judgment and thought content.   Assessment and Plan:  Pregnancy: G1P0 at [redacted]w[redacted]d  1. Encounter for supervision of normal pregnancy, antepartum, unspecified gravidity - HSV outbreak on 2/11 based on MAU notes  - Patient currently on suppression, according to up to date an outbreak occurring in the third trimester it is recommended C/S be performed unless 6 weeks of no outbreak prior to delivery  - Discussed plan of care with Dr Rip Harbour who recommends primary C/S at 39 weeks based on up to date  - Educated and  discussed recommendations with patient, informed that 39 weeks would be on 2/29 (leap year). Patient nervous about C/S but agrees to plan and date of delivery  - Plan to scheduled next appointment with MD to answer patient's questions if any  - POCT CBG (Fasting - Glucose)  2. HSV (herpes simplex virus) anogenital infection - Continue suppression until delivery  - Primary C/S scheduled for 2/29 due to outbreak on 2/11   3. Diet controlled gestational diabetes mellitus (GDM), antepartum - Patient has not been taking CBGs over the past week due to running out of lancets  - Discussed importance of taking glucose levels and monitoring, educated on risk to fetus with no monitoring including admission to NICU and/or fetal death, patient verbalizes understanding  - Refill rx for lancets sent to pharmacy of choice  - Random glucose done today in office 92  - ACCU-CHEK FASTCLIX LANCETS MISC; 1 Units by Percutaneous route 4 (four) times daily.  Dispense: 100 each; Refill: 0  Term labor symptoms and general obstetric precautions including but not limited to vaginal bleeding, contractions, leaking of fluid and fetal movement were reviewed in detail with the patient. Please refer to After Visit Summary for other counseling recommendations.  Return in about 1 week (around 04/21/2018) for [place].  Future Appointments  Date Time Provider [place]  04/20/2018  3:00 PM Chancy Milroy, MD [place] None    Lajean Manes, CNM

## 2018-04-18 ENCOUNTER — Encounter (HOSPITAL_COMMUNITY): Payer: Self-pay

## 2018-04-19 NOTE — H&P (Signed)
Kayla Liu is an 23 y.o. G1P0 [redacted]w[redacted]d female.   Chief Complaint: primary HSV in pregnancy in 3rd trimester, recent outbreak HPI: needs primary C-section at 3 wks for risk of vertical HSV transmission.  Past Medical History:  Diagnosis Date  . Gestational diabetes   . HSV (herpes simplex virus) infection 2019  . Medical history non-contributory     Past Surgical History:  Procedure Laterality Date  . NO PAST SURGERIES      Family History  Problem Relation Age of Onset  . Hypertension Maternal Aunt    Social History:  reports that she has never smoked. She has never used smokeless tobacco. She reports that she does not drink alcohol or use drugs.   No Known Allergies  No medications prior to admission.     A comprehensive review of systems was negative.  Last menstrual period 07/05/2017. General appearance: alert, cooperative and appears stated age Head: Normocephalic, without obvious abnormality, atraumatic Eyes: no scleral icterus Neck: supple, symmetrical, trachea midline Lungs: normal effort Heart: regular rate and rhythm Abdomen: soft, non-tender; bowel sounds normal; no masses,  no organomegaly Extremities: extremities normal, atraumatic, no cyanosis or edema Skin: Skin color, texture, turgor normal. No rashes or lesions Neurologic: Grossly normal   Lab Results  Component Value Date   WBC 8.2 01/10/2018   HGB 10.9 (L) 01/10/2018   HCT 31.1 (L) 01/10/2018   MCV 90 01/10/2018   PLT 283 01/10/2018         ABO, Rh: A/Positive/-- (08/28 1520)  Antibody: Negative (08/28 1520)  Rubella: <20.0 (11/20 2308)  RPR: Non Reactive (11/18 1108)  HBsAg: Negative (08/28 1520)  HIV: Non Reactive (11/18 1108)  GBS: Negative (02/12 1649)     Assessment/Plan Principal Problem:   HSV (herpes simplex virus) anogenital infection Active Problems:   Gestational diabetes mellitus (GDM), antepartum  For PLTCS. Risks include but are not limited to bleeding,  infection, injury to surrounding structures, including bowel, bladder and ureters, blood clots, and death.  Likelihood of success is high.   Donnamae Jude 04/19/2018, 9:33 AM

## 2018-04-20 ENCOUNTER — Ambulatory Visit (INDEPENDENT_AMBULATORY_CARE_PROVIDER_SITE_OTHER): Payer: Medicaid Other | Admitting: Obstetrics and Gynecology

## 2018-04-20 ENCOUNTER — Encounter: Payer: Self-pay | Admitting: Obstetrics and Gynecology

## 2018-04-20 VITALS — BP 123/78 | HR 94 | Wt 158.0 lb

## 2018-04-20 DIAGNOSIS — Z3490 Encounter for supervision of normal pregnancy, unspecified, unspecified trimester: Secondary | ICD-10-CM

## 2018-04-20 DIAGNOSIS — A609 Anogenital herpesviral infection, unspecified: Secondary | ICD-10-CM

## 2018-04-20 DIAGNOSIS — Z3493 Encounter for supervision of normal pregnancy, unspecified, third trimester: Secondary | ICD-10-CM

## 2018-04-20 DIAGNOSIS — Z3A38 38 weeks gestation of pregnancy: Secondary | ICD-10-CM

## 2018-04-20 DIAGNOSIS — O24410 Gestational diabetes mellitus in pregnancy, diet controlled: Secondary | ICD-10-CM

## 2018-04-20 NOTE — Patient Instructions (Signed)

## 2018-04-20 NOTE — Progress Notes (Signed)
Subjective:  Kayla Liu is a 23 y.o. G1P0 at [redacted]w[redacted]d being seen today for ongoing prenatal care.  She is currently monitored for the following issues for this high-risk pregnancy and has Supervision of normal pregnancy, antepartum; Gestational diabetes mellitus (GDM), antepartum; and HSV (herpes simplex virus) anogenital infection on their problem list.  Patient reports general discomforts of pregnancy.  Contractions: Irregular. Vag. Bleeding: None.  Movement: Present. Denies leaking of fluid.   The following portions of the patient's history were reviewed and updated as appropriate: allergies, current medications, past family history, past medical history, past social history, past surgical history and problem list. Problem list updated.  Objective:   Vitals:   04/20/18 1458  BP: 123/78  Pulse: 94  Weight: 158 lb (71.7 kg)    Fetal Status: Fetal Heart Rate (bpm): 135   Movement: Present     General:  Alert, oriented and cooperative. Patient is in no acute distress.  Skin: Skin is warm and dry. No rash noted.   Cardiovascular: Normal heart rate noted  Respiratory: Normal respiratory effort, no problems with respiration noted  Abdomen: Soft, gravid, appropriate for gestational age.       Pelvic:  Cervical exam deferred        Extremities: Normal range of motion.     Mental Status: Normal mood and affect. Normal behavior. Normal judgment and thought content.   Urinalysis:      Assessment and Plan:  Pregnancy: G1P0 at [redacted]w[redacted]d  1. Encounter for supervision of normal pregnancy, antepartum, unspecified gravidity Stable  2. HSV (herpes simplex virus) anogenital infection For primary c section on Sat   3. Diet controlled gestational diabetes mellitus (GDM), antepartum Did not bring CBG's  Term labor symptoms and general obstetric precautions including but not limited to vaginal bleeding, contractions, leaking of fluid and fetal movement were reviewed in detail with the  patient. Please refer to After Visit Summary for other counseling recommendations.  No follow-ups on file.   Chancy Milroy, MD

## 2018-04-21 NOTE — Patient Instructions (Signed)
Kayla Liu  04/21/2018   Your procedure is scheduled on:  04/23/2018  Arrive at 61 at Entrance C on [address] at [facility]. You are invited to use the FREE valet parking or use the Visitor's parking deck.  Pick up the phone at the desk and dial [phone].  Call this number if you have problems the morning of surgery: [phone]  Remember:   Do not eat food:(After Midnight) Desps de medianoche.  Do not drink clear liquids: (After Midnight) Desps de medianoche.  Take these medicines the morning of surgery with A SIP OF WATER:  valtrex   Do not wear jewelry, make-up or nail polish.  Do not wear lotions, powders, or perfumes. Do not wear deodorant.  Do not shave 48 hours prior to surgery.  Do not bring valuables to the hospital.  [facility] is not   responsible for any belongings or valuables brought to the hospital.  Contacts, dentures or bridgework may not be worn into surgery.  Leave suitcase in the car. After surgery it may be brought to your room.  For patients admitted to the hospital, checkout time is 11:00 AM the day of              discharge.      Please read over the following fact sheets that you were given:     Preparing for Surgery

## 2018-04-22 ENCOUNTER — Encounter (HOSPITAL_COMMUNITY)
Admission: RE | Admit: 2018-04-22 | Discharge: 2018-04-22 | Disposition: A | Discharge status: Home / Self Care | Payer: Medicaid Other | Source: Ambulatory Visit

## 2018-04-22 DIAGNOSIS — Z01812 Encounter for preprocedural laboratory examination: Secondary | ICD-10-CM | POA: Insufficient documentation

## 2018-04-22 LAB — CBC
HCT: 34 % — ABNORMAL LOW (ref 36.0–46.0)
Hemoglobin: 11.3 g/dL — ABNORMAL LOW (ref 12.0–15.0)
MCH: 32.1 pg (ref 26.0–34.0)
MCHC: 33.2 g/dL (ref 30.0–36.0)
MCV: 96.6 fL (ref 80.0–100.0)
Platelets: 226 10*3/uL (ref 150–400)
RBC: 3.52 MIL/uL — ABNORMAL LOW (ref 3.87–5.11)
RDW: 14.6 % (ref 11.5–15.5)
WBC: 8 10*3/uL (ref 4.0–10.5)
nRBC: 0 % (ref 0.0–0.2)

## 2018-04-22 NOTE — Anesthesia Preprocedure Evaluation (Addendum)
Anesthesia Evaluation   Patient awake    Reviewed: Allergy & Precautions, NPO status , Patient's Chart, lab work & pertinent test results  Airway Mallampati: II  TM Distance: >3 FB Neck ROM: Full    Dental no notable dental hx. (+) Teeth Intact   Pulmonary neg pulmonary ROS,    Pulmonary exam normal breath sounds clear to auscultation       Cardiovascular negative cardio ROS Normal cardiovascular exam Rhythm:Regular Rate:Normal     Neuro/Psych negative neurological ROS  negative psych ROS   GI/Hepatic negative GI ROS,   Endo/Other  diabetes  Renal/GU      Musculoskeletal   Abdominal   Peds  Hematology Hgb 11.3 Plts 174  Pt refuses blood Products   Anesthesia Other Findings   Reproductive/Obstetrics (+) Pregnancy                            Anesthesia Physical Anesthesia Plan  ASA: II  Anesthesia Plan: Spinal   Post-op Pain Management:    Induction: Intravenous  PONV Risk Score and Plan: 2 and Treatment may vary due to age or medical condition, Dexamethasone and Ondansetron  Airway Management Planned: Nasal Cannula and Natural Airway  Additional Equipment:   Intra-op Plan:   Post-operative Plan:   Informed Consent: I have reviewed the patients History and Physical, chart, labs and discussed the procedure including the risks, benefits and alternatives for the proposed anesthesia with the patient or authorized representative who has indicated his/her understanding and acceptance.     Dental advisory given  Plan Discussed with: CRNA  Anesthesia Plan Comments: (C/S for Active HSV . Pt refuses blood products form signed.)        Anesthesia Quick Evaluation

## 2018-04-23 ENCOUNTER — Inpatient Hospital Stay (HOSPITAL_COMMUNITY): Payer: Medicaid Other | Admitting: Anesthesiology

## 2018-04-23 ENCOUNTER — Other Ambulatory Visit: Payer: Self-pay

## 2018-04-23 ENCOUNTER — Inpatient Hospital Stay (HOSPITAL_COMMUNITY)
Admission: RE | Admit: 2018-04-23 | Discharge: 2018-04-26 | DRG: 787 | Disposition: A | Discharge status: Home / Self Care | Payer: Medicaid Other | Attending: Family Medicine | Admitting: Family Medicine

## 2018-04-23 ENCOUNTER — Other Ambulatory Visit: Payer: Self-pay | Admitting: Obstetrics and Gynecology

## 2018-04-23 ENCOUNTER — Encounter (HOSPITAL_COMMUNITY)
Admission: RE | Disposition: A | Discharge status: Home / Self Care | Payer: Self-pay | Source: Home / Self Care | Attending: Family Medicine

## 2018-04-23 ENCOUNTER — Encounter: Payer: Self-pay | Admitting: Obstetrics and Gynecology

## 2018-04-23 DIAGNOSIS — O9832 Other infections with a predominantly sexual mode of transmission complicating childbirth: Secondary | ICD-10-CM | POA: Diagnosis present

## 2018-04-23 DIAGNOSIS — Z3A38 38 weeks gestation of pregnancy: Secondary | ICD-10-CM | POA: Diagnosis not present

## 2018-04-23 DIAGNOSIS — O9081 Anemia of the puerperium: Secondary | ICD-10-CM | POA: Diagnosis not present

## 2018-04-23 DIAGNOSIS — Z789 Other specified health status: Secondary | ICD-10-CM | POA: Diagnosis present

## 2018-04-23 DIAGNOSIS — O24419 Gestational diabetes mellitus in pregnancy, unspecified control: Secondary | ICD-10-CM | POA: Diagnosis present

## 2018-04-23 DIAGNOSIS — O24429 Gestational diabetes mellitus in childbirth, unspecified control: Secondary | ICD-10-CM | POA: Diagnosis not present

## 2018-04-23 DIAGNOSIS — A6000 Herpesviral infection of urogenital system, unspecified: Secondary | ICD-10-CM | POA: Diagnosis present

## 2018-04-23 DIAGNOSIS — A609 Anogenital herpesviral infection, unspecified: Secondary | ICD-10-CM | POA: Diagnosis present

## 2018-04-23 DIAGNOSIS — O3413 Maternal care for benign tumor of corpus uteri, third trimester: Secondary | ICD-10-CM | POA: Diagnosis present

## 2018-04-23 DIAGNOSIS — D62 Acute posthemorrhagic anemia: Secondary | ICD-10-CM | POA: Diagnosis not present

## 2018-04-23 DIAGNOSIS — O24420 Gestational diabetes mellitus in childbirth, diet controlled: Secondary | ICD-10-CM | POA: Diagnosis present

## 2018-04-23 DIAGNOSIS — Z3A39 39 weeks gestation of pregnancy: Secondary | ICD-10-CM | POA: Diagnosis not present

## 2018-04-23 DIAGNOSIS — D259 Leiomyoma of uterus, unspecified: Secondary | ICD-10-CM | POA: Diagnosis present

## 2018-04-23 DIAGNOSIS — Z98891 History of uterine scar from previous surgery: Secondary | ICD-10-CM

## 2018-04-23 LAB — SYPHILIS: RPR W/REFLEX TO RPR TITER AND TREPONEMAL ANTIBODIES, TRADITIONAL SCREENING AND DIAGNOSIS ALGORITHM: RPR Ser Ql: NONREACTIVE

## 2018-04-23 LAB — GLUCOSE, CAPILLARY
Glucose-Capillary: 82 mg/dL (ref 70–99)
Glucose-Capillary: 80 mg/dL (ref 70–99)

## 2018-04-23 LAB — NO BLOOD PRODUCTS

## 2018-04-23 SURGERY — Surgical Case
Anesthesia: Spinal | Wound class: Clean Contaminated

## 2018-04-23 MED ORDER — KETOROLAC TROMETHAMINE 30 MG/ML IJ SOLN: 30.0000 mg | Freq: Four times a day (QID) | INTRAMUSCULAR | Status: DC | PRN

## 2018-04-23 MED ORDER — ACETAMINOPHEN 325 MG PO TABS
650.0000 mg | ORAL_TABLET | ORAL | Status: DC | PRN
  Administered 2018-04-24: 650 mg via ORAL

## 2018-04-23 MED ORDER — NALBUPHINE HCL 10 MG/ML IJ SOLN
5.0000 mg | INTRAMUSCULAR | Status: DC | PRN
  Administered 2018-04-23: 5 mg via INTRAVENOUS
  Filled 2018-04-23: qty 1

## 2018-04-23 MED ORDER — KETOROLAC TROMETHAMINE 30 MG/ML IJ SOLN
30.0000 mg | Freq: Four times a day (QID) | INTRAMUSCULAR | Status: AC | PRN
  Administered 2018-04-23: 30 mg via INTRAVENOUS
  Filled 2018-04-23: qty 1

## 2018-04-23 MED ORDER — OXYCODONE HCL 5 MG PO TABS
5.0000 mg | ORAL_TABLET | ORAL | Status: DC | PRN
  Administered 2018-04-24: 10 mg via ORAL
  Administered 2018-04-24 – 2018-04-25 (×2): 5 mg via ORAL
  Filled 2018-04-23: qty 1
  Filled 2018-04-23: qty 2

## 2018-04-23 MED ORDER — ACETAMINOPHEN 500 MG PO TABS: 1000.0000 mg | ORAL_TABLET | ORAL | Status: DC

## 2018-04-23 MED ORDER — ONDANSETRON HCL 4 MG/2ML IJ SOLN: 4.0000 mg | Freq: Three times a day (TID) | INTRAMUSCULAR | Status: DC | PRN

## 2018-04-23 MED ORDER — ENOXAPARIN SODIUM 40 MG/0.4ML ~~LOC~~ SOLN
40.0000 mg | SUBCUTANEOUS | Status: DC
  Administered 2018-04-24 – 2018-04-26 (×3): 40 mg via SUBCUTANEOUS
  Filled 2018-04-23 (×3): qty 0.4

## 2018-04-23 MED ORDER — MORPHINE SULFATE (PF) 0.5 MG/ML IJ SOLN
INTRAMUSCULAR | Status: AC
  Filled 2018-04-23: qty 10

## 2018-04-23 MED ORDER — BUPIVACAINE IN DEXTROSE 0.75-8.25 % IT SOLN
INTRATHECAL | Status: DC | PRN
  Administered 2018-04-23: 1.5 mL via INTRATHECAL

## 2018-04-23 MED ORDER — CEFAZOLIN SODIUM-DEXTROSE 2-4 GM/100ML-% IV SOLN
2.0000 g | INTRAVENOUS | Status: AC
  Administered 2018-04-23: 2 g via INTRAVENOUS

## 2018-04-23 MED ORDER — KETOROLAC TROMETHAMINE 30 MG/ML IJ SOLN
INTRAMUSCULAR | Status: AC
  Filled 2018-04-23: qty 1

## 2018-04-23 MED ORDER — DIPHENHYDRAMINE HCL 50 MG/ML IJ SOLN: 12.5000 mg | INTRAMUSCULAR | Status: DC | PRN

## 2018-04-23 MED ORDER — DIBUCAINE 1 % RE OINT: 1.0000 "application " | TOPICAL_OINTMENT | RECTAL | Status: DC | PRN

## 2018-04-23 MED ORDER — SCOPOLAMINE 1 MG/3DAYS TD PT72: 1.0000 | MEDICATED_PATCH | Freq: Once | TRANSDERMAL | Status: DC

## 2018-04-23 MED ORDER — KETOROLAC TROMETHAMINE 30 MG/ML IJ SOLN: 30.0000 mg | Freq: Once | INTRAMUSCULAR | Status: DC | PRN

## 2018-04-23 MED ORDER — PHENYLEPHRINE HCL-NACL 20-0.9 MG/250ML-% IV SOLN
INTRAVENOUS | Status: DC | PRN
  Administered 2018-04-23: 60 ug/min via INTRAVENOUS

## 2018-04-23 MED ORDER — MEPERIDINE HCL 25 MG/ML IJ SOLN: 6.2500 mg | INTRAMUSCULAR | Status: DC | PRN

## 2018-04-23 MED ORDER — SIMETHICONE 80 MG PO CHEW
80.0000 mg | CHEWABLE_TABLET | Freq: Three times a day (TID) | ORAL | Status: DC
  Administered 2018-04-24 – 2018-04-26 (×7): 80 mg via ORAL
  Filled 2018-04-23 (×9): qty 1

## 2018-04-23 MED ORDER — DIPHENHYDRAMINE HCL 25 MG PO CAPS: 25.0000 mg | ORAL_CAPSULE | Freq: Four times a day (QID) | ORAL | Status: DC | PRN

## 2018-04-23 MED ORDER — OXYCODONE HCL 5 MG/5ML PO SOLN: 5.0000 mg | Freq: Once | ORAL | Status: DC | PRN

## 2018-04-23 MED ORDER — ONDANSETRON HCL 4 MG/2ML IJ SOLN: 4.0000 mg | Freq: Once | INTRAMUSCULAR | Status: DC | PRN

## 2018-04-23 MED ORDER — MORPHINE SULFATE (PF) 0.5 MG/ML IJ SOLN
INTRAMUSCULAR | Status: DC | PRN
  Administered 2018-04-23: .15 mg via INTRATHECAL

## 2018-04-23 MED ORDER — PHENYLEPHRINE HCL-NACL 20-0.9 MG/250ML-% IV SOLN
INTRAVENOUS | Status: AC
  Filled 2018-04-23: qty 250

## 2018-04-23 MED ORDER — KETOROLAC TROMETHAMINE 30 MG/ML IJ SOLN
30.0000 mg | Freq: Four times a day (QID) | INTRAMUSCULAR | Status: AC | PRN
  Administered 2018-04-23: 30 mg via INTRAMUSCULAR

## 2018-04-23 MED ORDER — FENTANYL CITRATE (PF) 100 MCG/2ML IJ SOLN
INTRAMUSCULAR | Status: AC
  Filled 2018-04-23: qty 2

## 2018-04-23 MED ORDER — LACTATED RINGERS IV SOLN
INTRAVENOUS | Status: DC
  Administered 2018-04-23: 10:00:00 via INTRAVENOUS

## 2018-04-23 MED ORDER — OXYCODONE HCL 5 MG PO TABS: 5.0000 mg | ORAL_TABLET | Freq: Once | ORAL | Status: DC | PRN

## 2018-04-23 MED ORDER — TETANUS-DIPHTH-ACELL PERTUSSIS 5-2.5-18.5 LF-MCG/0.5 IM SUSP: 0.5000 mL | Freq: Once | INTRAMUSCULAR | Status: DC

## 2018-04-23 MED ORDER — WITCH HAZEL-GLYCERIN EX PADS: 1.0000 "application " | MEDICATED_PAD | CUTANEOUS | Status: DC | PRN

## 2018-04-23 MED ORDER — LACTATED RINGERS IV SOLN
INTRAVENOUS | Status: DC
  Administered 2018-04-23 – 2018-04-24 (×2): via INTRAVENOUS

## 2018-04-23 MED ORDER — SENNOSIDES-DOCUSATE SODIUM 8.6-50 MG PO TABS
2.0000 | ORAL_TABLET | Freq: Every evening | ORAL | Status: DC | PRN
  Administered 2018-04-24 – 2018-04-25 (×2): 2 via ORAL
  Filled 2018-04-23 (×2): qty 2

## 2018-04-23 MED ORDER — NALBUPHINE HCL 10 MG/ML IJ SOLN: 5.0000 mg | Freq: Once | INTRAMUSCULAR | Status: DC | PRN

## 2018-04-23 MED ORDER — DEXAMETHASONE SODIUM PHOSPHATE 4 MG/ML IJ SOLN
INTRAMUSCULAR | Status: DC | PRN
  Administered 2018-04-23: 4 mg via INTRAVENOUS

## 2018-04-23 MED ORDER — SODIUM CHLORIDE 0.9% FLUSH: 3.0000 mL | INTRAVENOUS | Status: DC | PRN

## 2018-04-23 MED ORDER — NALOXONE HCL 4 MG/10ML IJ SOLN
1.0000 ug/kg/h | INTRAVENOUS | Status: DC | PRN
  Filled 2018-04-23: qty 5

## 2018-04-23 MED ORDER — ONDANSETRON HCL 4 MG/2ML IJ SOLN
INTRAMUSCULAR | Status: DC | PRN
  Administered 2018-04-23: 4 mg via INTRAVENOUS

## 2018-04-23 MED ORDER — KETOROLAC TROMETHAMINE 15 MG/ML IJ SOLN
15.0000 mg | INTRAMUSCULAR | Status: DC
  Filled 2018-04-23: qty 1

## 2018-04-23 MED ORDER — OXYTOCIN 10 UNIT/ML IJ SOLN
INTRAVENOUS | Status: DC | PRN
  Administered 2018-04-23: 40 [IU] via INTRAVENOUS

## 2018-04-23 MED ORDER — NALBUPHINE HCL 10 MG/ML IJ SOLN: 5.0000 mg | INTRAMUSCULAR | Status: DC | PRN

## 2018-04-23 MED ORDER — LACTATED RINGERS IV SOLN
INTRAVENOUS | Status: DC | PRN
  Administered 2018-04-23: 12:00:00 via INTRAVENOUS

## 2018-04-23 MED ORDER — DIPHENHYDRAMINE HCL 25 MG PO CAPS: 25.0000 mg | ORAL_CAPSULE | ORAL | Status: DC | PRN

## 2018-04-23 MED ORDER — DEXAMETHASONE SODIUM PHOSPHATE 4 MG/ML IJ SOLN
INTRAMUSCULAR | Status: AC
  Filled 2018-04-23: qty 1

## 2018-04-23 MED ORDER — ACETAMINOPHEN 500 MG PO TABS
1000.0000 mg | ORAL_TABLET | Freq: Four times a day (QID) | ORAL | Status: DC | PRN
  Administered 2018-04-23 – 2018-04-26 (×5): 1000 mg via ORAL
  Filled 2018-04-23 (×5): qty 2

## 2018-04-23 MED ORDER — FENTANYL CITRATE (PF) 100 MCG/2ML IJ SOLN
INTRAMUSCULAR | Status: DC | PRN
  Administered 2018-04-23: 15 ug via INTRATHECAL

## 2018-04-23 MED ORDER — GABAPENTIN 100 MG PO CAPS
100.0000 mg | ORAL_CAPSULE | ORAL | Status: DC
  Filled 2018-04-23: qty 1

## 2018-04-23 MED ORDER — HYDROMORPHONE HCL 1 MG/ML IJ SOLN: 0.2500 mg | INTRAMUSCULAR | Status: DC | PRN

## 2018-04-23 MED ORDER — PRENATAL MULTIVITAMIN CH
1.0000 | ORAL_TABLET | Freq: Every day | ORAL | Status: DC
  Administered 2018-04-24 – 2018-04-26 (×3): 1 via ORAL
  Filled 2018-04-23 (×3): qty 1

## 2018-04-23 MED ORDER — OXYTOCIN 40 UNITS IN NORMAL SALINE INFUSION - SIMPLE MED
INTRAVENOUS | Status: AC
  Filled 2018-04-23: qty 1000

## 2018-04-23 MED ORDER — LACTATED RINGERS IV SOLN
INTRAVENOUS | Status: DC | PRN
  Administered 2018-04-23 (×2): via INTRAVENOUS

## 2018-04-23 MED ORDER — COCONUT OIL OIL: 1.0000 "application " | TOPICAL_OIL | Status: DC | PRN

## 2018-04-23 MED ORDER — ONDANSETRON HCL 4 MG/2ML IJ SOLN
INTRAMUSCULAR | Status: AC
  Filled 2018-04-23: qty 2

## 2018-04-23 MED ORDER — POLYETHYLENE GLYCOL 3350 17 G PO PACK
17.0000 g | PACK | Freq: Every day | ORAL | Status: DC
  Administered 2018-04-24 – 2018-04-26 (×3): 17 g via ORAL
  Filled 2018-04-23 (×3): qty 1

## 2018-04-23 MED ORDER — ACETAMINOPHEN 500 MG PO TABS
ORAL_TABLET | ORAL | Status: AC
  Filled 2018-04-23: qty 2

## 2018-04-23 MED ORDER — NALOXONE HCL 0.4 MG/ML IJ SOLN: 0.4000 mg | INTRAMUSCULAR | Status: DC | PRN

## 2018-04-23 MED ORDER — OXYTOCIN 40 UNITS IN NORMAL SALINE INFUSION - SIMPLE MED: 2.5000 [IU]/h | INTRAVENOUS | Status: AC

## 2018-04-23 MED ORDER — MENTHOL 3 MG MT LOZG: 1.0000 | LOZENGE | OROMUCOSAL | Status: DC | PRN

## 2018-04-23 SURGICAL SUPPLY — 34 items
BENZOIN TINCTURE PRP APPL 2/3 (GAUZE/BANDAGES/DRESSINGS) ×3 IMPLANT
CHLORAPREP W/TINT 26ML (MISCELLANEOUS) ×3 IMPLANT
CLAMP CORD UMBIL (MISCELLANEOUS) IMPLANT
CLOSURE WOUND 1/2 X4 (GAUZE/BANDAGES/DRESSINGS) ×1
CLOTH BEACON ORANGE TIMEOUT ST (SAFETY) ×3 IMPLANT
DRSG OPSITE POSTOP 4X10 (GAUZE/BANDAGES/DRESSINGS) ×3 IMPLANT
ELECT REM PT RETURN 9FT ADLT (ELECTROSURGICAL) ×3
ELECTRODE REM PT RTRN 9FT ADLT (ELECTROSURGICAL) ×1 IMPLANT
EXTRACTOR VACUUM M CUP 4 TUBE (SUCTIONS) IMPLANT
EXTRACTOR VACUUM M CUP 4' TUBE (SUCTIONS)
GLOVE BIOGEL PI IND STRL 7.0 (GLOVE) ×2 IMPLANT
GLOVE BIOGEL PI INDICATOR 7.0 (GLOVE) ×4
GLOVE ECLIPSE 7.0 STRL STRAW (GLOVE) ×6 IMPLANT
GOWN STRL REUS W/TWL LRG LVL3 (GOWN DISPOSABLE) ×6 IMPLANT
KIT ABG SYR 3ML LUER SLIP (SYRINGE) IMPLANT
NDL HYPO 25X5/8 SAFETYGLIDE (NEEDLE) IMPLANT
NEEDLE HYPO 22GX1.5 SAFETY (NEEDLE) ×3 IMPLANT
NEEDLE HYPO 25X5/8 SAFETYGLIDE (NEEDLE) IMPLANT
NS IRRIG 1000ML POUR BTL (IV SOLUTION) ×3 IMPLANT
PACK C SECTION WH (CUSTOM PROCEDURE TRAY) ×3 IMPLANT
PAD ABD 7.5X8 STRL (GAUZE/BANDAGES/DRESSINGS) ×3 IMPLANT
PAD ABD 8X10 STRL (GAUZE/BANDAGES/DRESSINGS) ×2 IMPLANT
PAD OB MATERNITY 4.3X12.25 (PERSONAL CARE ITEMS) ×3 IMPLANT
PENCIL SMOKE EVAC W/HOLSTER (ELECTROSURGICAL) ×3 IMPLANT
RTRCTR C-SECT PINK 25CM LRG (MISCELLANEOUS) ×3 IMPLANT
SPONGE GAUZE 4X4 12PLY STER LF (GAUZE/BANDAGES/DRESSINGS) ×3 IMPLANT
STRIP CLOSURE SKIN 1/2X4 (GAUZE/BANDAGES/DRESSINGS) ×2 IMPLANT
SUT VIC AB 0 CTX 36 (SUTURE) ×6
SUT VIC AB 0 CTX36XBRD ANBCTRL (SUTURE) ×3 IMPLANT
SUT VIC AB 4-0 KS 27 (SUTURE) ×3 IMPLANT
SYR 30ML LL (SYRINGE) ×3 IMPLANT
TOWEL OR 17X24 6PK STRL BLUE (TOWEL DISPOSABLE) ×3 IMPLANT
TRAY FOLEY W/BAG SLVR 14FR LF (SET/KITS/TRAYS/PACK) ×3 IMPLANT
WATER STERILE IRR 1000ML POUR (IV SOLUTION) ×3 IMPLANT

## 2018-04-23 NOTE — H&P (Signed)
H&P reviewed. D/w pt and she declines all blood products even though it would be life saving. Pt verbalizes she understands this. CBC normal. Can proceed when OR is ready.  Durene Romans MD Attending Center for [employer] (Faculty Practice) 04/23/2018 Time: [phone]

## 2018-04-23 NOTE — Lactation Note (Addendum)
This note was copied from a baby's chart. Lactation Consultation Note  Patient Name: Kayla Liu [id number] Date: 04/23/2018 Reason for consult: Follow-up assessment;Mother's request P1, 9 hour female infant,  Per mom, infant been very sleepy and not latching to breast. Mom attempted to latch infant in football hold, infant opened his  mouth wide and suckled for 6 minutes, few swallows observed and infant  became very sleepy, mom's nipple was well rounded when infant released breast. Infant was given 9 ml of colostrum by spoon burped and appeared content after the feeding. Mom will continue work toward infant increasing latch duration, LC  informed mom this is not uncommon for infant to be sleepy in first 24 hours. Mom has very  good  colostrum volume she plans to breastfeed  infant first then offer infant the  additional 10 ml  of colostrum that she hand expressed at the next feeding.  LC gave mom a hand pump and explain how to use, assemble, clean and store pump parts. Mom knows to call Nurse or [place] if she has any questions, concerns or needs assistance with latching infant to breast.  Mom knows to breastfeed according to hunger cues, 8 or more times within 24 hours.   Maternal Data Formula Feeding for Exclusion: Yes Reason for exclusion: Mother's choice to formula and breast feed on admission Has patient been taught Hand Expression?: Yes Does the patient have breastfeeding experience prior to this delivery?: No  Feeding Feeding Type: Breast Fed  LATCH Score Latch: Too sleepy or reluctant, no latch achieved, no sucking elicited.  Audible Swallowing: A few with stimulation  Type of Nipple: Everted at rest and after stimulation  Comfort (Breast/Nipple): Soft / non-tender  Hold (Positioning): Assistance needed to correctly position infant at breast and maintain latch.  LATCH Score: 6  Interventions Interventions: Assisted with latch;Skin to skin;Hand express;Support  pillows;Adjust position;Breast compression;Position options;Expressed milk;Hand pump  Lactation Tools Discussed/Used WIC Program: Yes Pump Review: Setup, frequency, and cleaning Initiated by:: Vicente Serene, [place] Date initiated:: 04/23/18   Consult Status Consult Status: Follow-up Date: 04/24/18 Follow-up type: In-patient    Vicente Serene 04/23/2018, 9:01 PM

## 2018-04-23 NOTE — Addendum Note (Signed)
Addendum  created 04/23/18 1823 by Hewitt Blade, CRNA   Clinical Note Signed

## 2018-04-23 NOTE — Lactation Note (Signed)
This note was copied from a baby's chart. Lactation Consultation Note  Patient Name: Kayla Liu [id number] Date: 04/23/2018 Reason for consult: Initial assessment;Primapara;1st time breastfeeding;Infant < 6lbs;Term  P1 mother whose infant is now 10 hours old.  This is a term baby weighing < 6 lbs  Baby was sleeping on mother's chest when I arrived.  Encouraged STS and feeding baby 8-12 times/24 hours or sooner if baby shows feeding cues.  Mother's breasts are soft and non tender and nipples are everted and intact.  Encouraged hand expression and colostrum container with instructions for use provided.  Milk storage times reviewed and finger feeding demonstrated.  Mom made aware of O/P services, breastfeeding support groups, community resources, and our phone # for post-discharge questions.  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  She has [place] and will speak to the [facility] reps if she desires a pump.  Visitors present.     Maternal Data Formula Feeding for Exclusion: No Has patient been taught Hand Expression?: Yes Does the patient have breastfeeding experience prior to this delivery?: No  Feeding Feeding Type: Breast Fed  LATCH Score Latch: Repeated attempts needed to sustain latch, nipple held in mouth throughout feeding, stimulation needed to elicit sucking reflex.  Audible Swallowing: None  Type of Nipple: Everted at rest and after stimulation  Comfort (Breast/Nipple): Soft / non-tender  Hold (Positioning): Assistance needed to correctly position infant at breast and maintain latch.  LATCH Score: 6  Interventions    Lactation Tools Discussed/Used WIC Program: Yes   Consult Status Consult Status: Follow-up Date: 04/24/18 Follow-up type: In-patient    [name] 04/23/2018, 5:53 PM

## 2018-04-23 NOTE — Anesthesia Postprocedure Evaluation (Signed)
Anesthesia Post Note  Patient: Kayla Liu  Procedure(s) Performed: CESAREAN SECTION (N/A )     Patient location during evaluation: Mother Baby Anesthesia Type: Spinal Level of consciousness: oriented and awake and alert Pain management: pain level controlled Vital Signs Assessment: post-procedure vital signs reviewed and stable Respiratory status: spontaneous breathing and respiratory function stable Cardiovascular status: blood pressure returned to baseline and stable Postop Assessment: no headache, no backache, no apparent nausea or vomiting and able to ambulate Anesthetic complications: no    Last Vitals:  Vitals:   04/23/18 1435 04/23/18 1545  BP: (!) 97/49 114/75  Pulse: 69 72  Resp: 16 18  Temp: (!) 36.4 C 36.7 C  SpO2: 100% 100%    Last Pain:  Vitals:   04/23/18 1645  TempSrc:   PainSc: 6    Pain Goal: Patients Stated Pain Goal: 4 (04/23/18 1645)                 Barnet Glasgow

## 2018-04-23 NOTE — Anesthesia Procedure Notes (Signed)
Spinal  Patient location during procedure: OB Start time: 04/23/2018 11:25 AM End time: 04/23/2018 11:29 AM Staffing Anesthesiologist: Barnet Glasgow, MD Performed: anesthesiologist  Preanesthetic Checklist Completed: patient identified, surgical consent, pre-op evaluation, timeout performed, IV checked, risks and benefits discussed and monitors and equipment checked Spinal Block Patient position: sitting Prep: site prepped and draped and DuraPrep Patient monitoring: heart rate, cardiac monitor, continuous pulse ox and blood pressure Approach: midline Location: L3-4 Injection technique: single-shot Needle Needle type: Pencan  Needle gauge: 24 G Needle length: 10 cm Needle insertion depth: 6 cm Assessment Sensory level: T4 Additional Notes 1 Attempt (s). Pt tolerated procedure well.

## 2018-04-23 NOTE — Anesthesia Postprocedure Evaluation (Signed)
Anesthesia Post Note  Patient: Kayla Liu  Procedure(s) Performed: CESAREAN SECTION (N/A )     Patient location during evaluation: Mother Baby Anesthesia Type: Spinal Level of consciousness: awake and alert Pain management: pain level controlled Vital Signs Assessment: post-procedure vital signs reviewed and stable Respiratory status: spontaneous breathing, nonlabored ventilation and respiratory function stable Cardiovascular status: stable Postop Assessment: no headache, no backache, no apparent nausea or vomiting, patient able to bend at knees, able to ambulate, adequate PO intake and spinal receding Anesthetic complications: no    Last Vitals:  Vitals:   04/23/18 1645 04/23/18 1800  BP: 122/65 115/85  Pulse: 75 68  Resp: 18 18  Temp: 36.7 C 36.6 C  SpO2: 100% 99%    Last Pain:  Vitals:   04/23/18 1800  TempSrc: Axillary  PainSc: 0-No pain   Pain Goal: Patients Stated Pain Goal: 4 (04/23/18 1645)                 Stiles Maxcy Hristova

## 2018-04-23 NOTE — Op Note (Addendum)
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 Delivered. Fibroid uterus  PROCEDURE: primary low transverse cesarean section via pfannenstiel skin incision with double layer uterine closure  SURGEON: Surgeon(s) and Role:    Aletha Halim, MD - Primary  ASSISTANT: None  ANESTHESIA: spinal  ESTIMATED BLOOD LOSS: 339mL  DRAINS: 4101mL UOP via indwelling foley  TOTAL IV FLUIDS: 2572mL crystalloid  VTE PROPHYLAXIS: SCDs to bilateral lower extremities  ANTIBIOTICS: Two grams of Cefazolin were given., within 1 hour of skin incision  SPECIMENS: placenta to pathology  COMPLICATIONS: none  FINDINGS: No intra-abdominal adhesions were noted. Grossly normal uterus, tubes and ovaries. Fibroids: posterior left fundal subserosal approximately 1.5-2cm in size, anterior left fundal, subserosal and bordering on pedunculated approximately 3-4cm in size. . Clear amniotic fluid, cephalic female infant, weight 2665gm, APGARs 9/9, intact placenta.  PROCEDURE IN DETAIL: The patient was taken to the operating room where anesthesia was administered and normal fetal heart tones were confirmed. She was then prepped and draped in the normal fashion in the dorsal supine position with a leftward tilt.  After a time out was performed, a pfannensteil skin incision was made with the scalpel and carried through to the underlying layer of fascia. The fascia was then incised at the midline and this incision was extended laterally with the mayo scissors. Attention was turned to the superior aspect of the fascial incision which was grasped with the kocher clamps x 2, tented up and the rectus muscles were dissected off with the bovie. In a similar fashion the inferior aspect of the fascial incision was grasped with the kocher clamps, tented up and the rectus muscles dissected off with the mayo  scissors. The rectus muscles were then separated in the midline and the peritoneum was entered bluntly. The bladder blade was inserted and the vesicouterine peritoneum was identified, tented up and entered with the metzenbaum scissors. This incision was extended laterally and the bladder flap was created digitally. The bladder blade was reinserted.  A low transverse hysterotomy was made with the scalpel until the endometrial cavity was breached and the amniotic sac ruptured, yielding clear amniotic fluid. This incision was extended bluntly and the infant's head, shoulders and body were delivered atraumatically.The cord was clamped x 2 and cut, and the infant was handed to the awaiting pediatricians, after delayed cord clamping was done.  The placenta was manual extracted from the uterus after cord avulsion and then the uterus was exteriorized and cleared of all clots and debris. The hysterotomy was repaired with a running suture of 1-0 monocryl. A second imbricating layer of 1-0 monocryl suture was then placed to achieve excellent hemostasis.   The uterus and adnexa were then returned to the abdomen, and the hysterotomy and all operative sites were reinspected and excellent hemostasis was noted after irrigation and suction of the abdomen with warm saline.  The peritoneum was closed with a running stitch of 3-0 Vicryl. The fascia was reapproximated with 0 Vicryl in a simple running fashion bilaterally. The subcutaneous layer was then reapproximated with interrupted sutures of 2-0 plain gut, and the skin was then closed with 4-0 monocryl, in a subcuticular fashion.  The patient  tolerated the procedure well. Sponge, lap, needle, and instrument counts were correct x 2. The patient was transferred to the recovery room awake, alert and breathing independently in stable condition.  Durene Romans MD Attending Center  for [employer] farm manager)

## 2018-04-23 NOTE — Transfer of Care (Signed)
Immediate Anesthesia Transfer of Care Note  Patient: Kayla Liu  Procedure(s) Performed: CESAREAN SECTION (N/A )  Patient Location: PACU  Anesthesia Type:Spinal  Level of Consciousness: awake, alert  and patient cooperative  Airway & Oxygen Therapy: Patient Spontanous Breathing  Post-op Assessment: Report given to RN and Post -op Vital signs reviewed and stable  Post vital signs: Reviewed and stable  Last Vitals:  Vitals Value Taken Time  BP 114/71 04/23/2018  1:09 PM  Temp    Pulse 75 04/23/2018  1:11 PM  Resp 19 04/23/2018  1:11 PM  SpO2 100 % 04/23/2018  1:11 PM  Vitals shown include unvalidated device data.  Last Pain:  Vitals:   04/23/18 1000  TempSrc: Oral         Complications: No apparent anesthesia complications

## 2018-04-24 DIAGNOSIS — O9081 Anemia of the puerperium: Secondary | ICD-10-CM | POA: Diagnosis not present

## 2018-04-24 LAB — CREATININE, SERUM
Creatinine, Ser: 0.73 mg/dL (ref 0.44–1.00)
GFR calc Af Amer: >60 mL/min (ref >60)
GFR calc non Af Amer: >60 mL/min (ref >60)

## 2018-04-24 LAB — CBC
HCT: 24.7 % — ABNORMAL LOW (ref 36.0–46.0)
Hemoglobin: 8.2 g/dL — ABNORMAL LOW (ref 12.0–15.0)
MCH: 31.8 pg (ref 26.0–34.0)
MCHC: 33.2 g/dL (ref 30.0–36.0)
MCV: 95.7 fL (ref 80.0–100.0)
Platelets: 181 10*3/uL (ref 150–400)
RBC: 2.58 MIL/uL — ABNORMAL LOW (ref 3.87–5.11)
RDW: 14.4 % (ref 11.5–15.5)
WBC: 12.9 10*3/uL — ABNORMAL HIGH (ref 4.0–10.5)
nRBC: 0 % (ref 0.0–0.2)

## 2018-04-24 MED ORDER — IBUPROFEN 600 MG PO TABS
600.0000 mg | ORAL_TABLET | Freq: Four times a day (QID) | ORAL | Status: DC | PRN
  Administered 2018-04-24 – 2018-04-26 (×5): 600 mg via ORAL
  Filled 2018-04-24 (×6): qty 1

## 2018-04-24 MED ORDER — SODIUM CHLORIDE 0.9 % IV SOLN
510.0000 mg | Freq: Once | INTRAVENOUS | Status: DC
  Filled 2018-04-24: qty 17

## 2018-04-24 NOTE — Progress Notes (Addendum)
Pt. States her whole body feels week after ambulating in the hall. Denies vision changes, no dizziness. BP 112/57. Pt. Positioned comfortably in bed, drinking fluids. Will continue to monitor.   Lewanda Rife, RN 04/24/2018 10:22 PM

## 2018-04-24 NOTE — Lactation Note (Signed)
This note was copied from a baby's chart. Lactation Consultation Note  Patient Name: Kayla Liu [id number] Date: 04/24/2018 Reason for consult: Follow-up assessment;NICU baby;Primapara Baby was transferred to the NICU.  Baby is 67 hours old and latching for brief times.  Mom has been hand expressing and syringe feeding.  DEBP set up and initiated with 27 mm flanges.  Mom has large erect nipples.  Good colostrum flow noted.  Colostrum containers left.  Instructed to both pump every 3 hours and hand express into containers.  Mom will transport milk to the NICU.  Encouraged to continue to latch the baby in the NICU and call for [facility] assist prn.  Providing Breastmilk For Your Baby in the NICU book left.  Maternal Data    Feeding Feeding Type: Breast Milk  LATCH Score                   Interventions    Lactation Tools Discussed/Used Pump Review: Setup, frequency, and cleaning;Milk Storage Initiated by:: [email or web address] Date initiated:: 04/24/18   Consult Status Consult Status: Follow-up Date: 04/25/18 Follow-up type: In-patient    Ave Filter 04/24/2018, 2:49 PM

## 2018-04-24 NOTE — Progress Notes (Signed)
Postpartum Day 1: Cesarean Delivery Subjective: Patient reports incisional pain, tolerating PO and no problems voiding.  No flatus.   Objective: Vital signs in last 24 hours: Temp:  [97.5 F (36.4 C)-98.5 F (36.9 C)] 98.2 F (36.8 C) (03/01 0940) Pulse Rate:  [55-82] 62 (03/01 0940) Resp:  [14-18] 18 (03/01 0940) BP: (96-122)/(48-85) 96/58 (03/01 0940) SpO2:  [98 %-100 %] 98 % (03/01 0940)  Physical Exam:  General: alert and no distress Lochia: appropriate Uterine Fundus: firm Incision: Pressure dressing in place Pelvic: No visible active lesions seen DVT Evaluation: No evidence of DVT seen on physical exam. Negative Homan's sign.No cords or calf tenderness.No significant calf/ankle edema.  Recent Labs    04/22/18 1115 04/24/18 0535  HGB 11.3* 8.2*  HCT 34.0* 24.7*    Assessment/Plan: Status post Cesarean section. Postoperative course complicated by anemia  Feraheme ordered for anemia No visible HSV lesions on external exam but given timing of recent outbreak, recommend neonatal prophylaxis with Acyclovir (Peds team informed). Continue current care.  Verita Schneiders, MD 04/24/2018, 10:21 AM

## 2018-04-25 MED ORDER — TRIAMTERENE-HCTZ 37.5-25 MG PO TABS: 1.0000 | ORAL_TABLET | Freq: Every day | ORAL | Status: DC

## 2018-04-25 MED ORDER — KETOROLAC TROMETHAMINE 60 MG/2ML IM SOLN
60.0000 mg | Freq: Once | INTRAMUSCULAR | Status: AC
  Administered 2018-04-25: 60 mg via INTRAMUSCULAR
  Filled 2018-04-25: qty 2

## 2018-04-25 MED ORDER — SENNOSIDES-DOCUSATE SODIUM 8.6-50 MG PO TABS: 2.0000 | ORAL_TABLET | Freq: Every evening | ORAL | 0 refills | Status: DC | PRN

## 2018-04-25 MED ORDER — OXYCODONE-ACETAMINOPHEN 5-325 MG PO TABS
1.0000 | ORAL_TABLET | Freq: Once | ORAL | Status: AC
  Administered 2018-04-25: 1 via ORAL
  Filled 2018-04-25: qty 1

## 2018-04-25 MED ORDER — IBUPROFEN 600 MG PO TABS: 600.0000 mg | ORAL_TABLET | Freq: Four times a day (QID) | ORAL | 1 refills | Status: DC | PRN

## 2018-04-25 MED ORDER — OXYCODONE-ACETAMINOPHEN 5-325 MG PO TABS: 1.0000 | ORAL_TABLET | ORAL | 0 refills | Status: AC | PRN

## 2018-04-25 NOTE — Progress Notes (Signed)
POSTPARTUM PROGRESS NOTE  POD #2  Subjective:  Kayla Liu is a 23 y.o. G1P1001 s/p pLTCS at [redacted]w[redacted]d.  She reports she doing well. No acute events overnight. Not ambulating well - using wheelchair to get around. She denies any problems with voiding or po intake. Denies nausea or vomiting. She has passed flatus. Pain is poorly controlled - pain currently 7/10.  Lochia is mild.  Objective: Blood pressure 113/69, pulse 67, temperature 98.2 F (36.8 C), temperature source Oral, resp. rate 17, height 4\' 10"  (1.473 m), weight 71.7 kg, last menstrual period 07/05/2017, SpO2 99 %, unknown if currently breastfeeding.  Physical Exam:  General: alert, cooperative and no distress Chest: no respiratory distress Heart:regular rate, distal pulses intact Abdomen: soft, nontender,  Uterine Fundus: firm, appropriately tender DVT Evaluation: No calf swelling or tenderness Extremities: no LE edema Skin: warm, dry; incision clean/dry/intact w/ honeycomb dressing in place  Recent Labs    04/22/18 1115 04/24/18 0535  HGB 11.3* 8.2*  HCT 34.0* 24.7*    Assessment/Plan: Kayla Liu is a 23 y.o. G1P1001 s/p pLTCS at [redacted]w[redacted]d for primary HSV outbreak.  POD#2 - Doing welll; pain poorly controlled - Will give Percocet and Toradol x 1 to help with current pain, then plan for scheduled Percocet and Motrin for better pain control.  H/H appropriate  Routine postpartum care  OOB, continue to encourage ambulation  Lovenox for VTE prophylaxis Anemia: asymptomatic  S/p Feraheme  Start po ferrous sulfate BID Contraception: POPs Feeding: Breast  Dispo: Plan for discharge POD#3.   LOS: 2 days   Mina Marble, D.O. [place], PGY1 04/25/2018, 10:57 AM

## 2018-04-25 NOTE — Discharge Instructions (Signed)

## 2018-04-25 NOTE — Lactation Note (Signed)
This note was copied from a baby's chart. 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  Visited with P1 Mom of term baby in the NICU.   Mom just returned from visiting baby and is not feeling well.  LC disassembled and washed the pump parts, rinsed, and set in basin to air dry.   Mom aware of importance of frequent pumping to support a full milk supply.  Referral sent to [facility] for pump on discharge. To call prn, and follow-up in am.   Consult Status Consult Status: Follow-up Date: 04/26/18 Follow-up type: In-patient    Broadus John 04/25/2018, 10:40 AM

## 2018-04-25 NOTE — Discharge Summary (Deleted)
Postpartum Discharge Summary     Patient Name: Kayla Liu DOB: [birth year] MRN: [id number]  Date of admission: 04/23/2018 Delivering Provider: Aletha Halim   Date of discharge: 04/25/2018  Admitting diagnosis: HSV Outbreak Intrauterine pregnancy: [redacted]w[redacted]d    Secondary diagnosis:  Principal Problem:   Status post cesarean delivery Active Problems:   Gestational diabetes mellitus (GDM), antepartum   HSV (herpes simplex virus) anogenital infection   No blood products   Postpartum anemia  Additional problems: None     Discharge diagnosis: Term Pregnancy Delivered, GDM A1 and Anemia                                                                                        Post partum procedures:IV Feraheme  Augmentation: None  Complications: None  Hospital course:  Sceduled C/S   23y.o. yo G1P1001 at 356w0das admitted to the hospital 04/23/2018 for scheduled cesarean section with the following indication:Active HSV. Patient did not have any visible HSV lesions on external exam, however given timing of recent outbreak patient was scheduled for c-section with prophylactic IV Cefazolin given 1 hour prior to incision.   Membrane Rupture Time/Date: 12:01 PM ,04/23/2018   Patient delivered a Viable infant.04/23/2018  Details of operation can be found in separate operative note.  Pateint had an uncomplicated postpartum course.  She is ambulating, tolerating a regular diet, passing flatus, and urinating well. Patient is discharged home in stable condition on  04/25/18. Patient received IV Feraheme transfusion for anemia. She denied any symptoms of anemia at time of discharge. She is to continue oral iron upon discharge.         Magnesium Sulfate recieved: No BMZ received: No  Physical exam  Vitals:   04/24/18 0940 04/24/18 1353 04/24/18 2156 04/25/18 0556  BP: (!) 96/58 (!) 108/59 (!) 112/57 113/69  Pulse: 62 65 66 67  Resp: _0 Temp: 98.2 F (36.8 C) 98.5 F (36.9 C)   98.2 F (36.8 C)  TempSrc: Oral Oral  Oral  SpO2: 98% 99%    Weight:      Height:       General: alert, cooperative and no distress Lochia: appropriate Uterine Fundus: firm Incision: Healing well with no significant drainage, Dressing is clean, dry, and intact DVT Evaluation: No evidence of DVT seen on physical exam. Negative Homan's sign. No significant calf/ankle edema. Labs: Lab Results  Component Value Date   WBC 12.9 (H) 04/24/2018   HGB 8.2 (L) 04/24/2018   HCT 24.7 (L) 04/24/2018   MCV 95.7 04/24/2018   PLT 181 04/24/2018   CMP Latest Ref Rng & Units 04/24/2018  Glucose 70 - 99 mg/dL -  BUN 6 - 20 mg/dL -  Creatinine 0.44 - 1.00 mg/dL 0.73  Sodium 135 - 145 mmol/L -  Potassium 3.5 - 5.1 mmol/L -  Chloride 98 - 111 mmol/L -  CO2 22 - 32 mmol/L -  Calcium 8.9 - 10.3 mg/dL -  Total Protein 6.5 - 8.1 g/dL -  Total Bilirubin 0.3 - 1.2 mg/dL -  Alkaline Phos 38 - 126 U/L -  AST 15 - 41  U/L -  ALT 0 - 44 U/L -    Discharge instruction: per After Visit Summary and "Baby and Me Booklet".  After visit meds:  Allergies as of 04/25/2018   No Known Allergies     Medication List    STOP taking these medications   ACCU-CHEK FASTCLIX LANCETS Misc   ACCU-CHEK NANO SMARTVIEW w/Device Kit   glucose blood test strip Commonly known as:  ACCU-CHEK SMARTVIEW   valACYclovir 500 MG tablet Commonly known as:  VALTREX     TAKE these medications   ibuprofen 600 MG tablet Commonly known as:  ADVIL,MOTRIN Take 1 tablet (600 mg total) by mouth every 6 (six) hours as needed for moderate pain.   oxyCODONE-acetaminophen 5-325 MG tablet Commonly known as:  PERCOCET Take 1 tablet by mouth every 4 (four) hours as needed for severe pain.   prenatal multivitamin Tabs tablet Take 1 tablet by mouth daily at 12 noon.   senna-docusate 8.6-50 MG tablet Commonly known as:  Senokot-S Take 2 tablets by mouth at bedtime as needed for mild constipation.       Diet: routine  diet  Activity: Advance as tolerated. Pelvic rest for 6 weeks.   Outpatient follow up:4 weeks Follow up Appt: Future Appointments  Date Time Provider [place]  04/29/2018 10:30 AM Woodroe Mode, MD [place] None  06/02/2018  3:00 PM Woodroe Mode, MD [place] None   Follow up Visit: [place]. Go on 04/29/2018.   Why:  at 10:30am for incision check. Contact information: [address] [place] [address] [phone]       Woodroe Mode, MD. Go on 06/02/2018.   Specialty:  Obstetrics and Gynecology Why:  at 3:00PM for 4-week postpartum visit Contact information: [place] [address] [phone]            Please schedule this patient for Postpartum visit in: 4 weeks with the following provider: Any provider For C/S patients schedule nurse incision check in weeks 2 weeks: yes High risk pregnancy complicated by: diet control GDM and HSV anogenital infection Delivery mode:  CS Anticipated Birth Control:  POPs PP Procedures needed: Incision check, PP CBC for anemia s/p Feraheme  Schedule Integrated BH visit: no   Newborn Data: Live born female  Birth Weight: 5 lb 14 oz (2665 g) APGAR: 9, 9  Newborn Delivery   Birth date/time:  04/23/2018 12:01:00 Delivery type:  C-Section, Low Transverse Trial of labor:  No C-section categorization:  Primary     Baby Feeding: Breast Disposition:NICU - Receiving IV Acyclovir    04/25/2018 [place], DO

## 2018-04-25 NOTE — Discharge Summary (Signed)
Postpartum Discharge Summary     Patient Name: Kayla Liu DOB: 1995/05/21 MRN: [id number]  Date of admission: 04/23/2018 Delivering Provider: Aletha Halim   Date of discharge: 04/26/2018  Admitting diagnosis: HSV Outbreak Intrauterine pregnancy: [redacted]w[redacted]d    Secondary diagnosis:  Principal Problem:   Status post cesarean delivery Active Problems:   Gestational diabetes mellitus (GDM), antepartum   HSV (herpes simplex virus) anogenital infection   No blood products   Postpartum anemia  Additional problems: None     Discharge diagnosis: Term Pregnancy Delivered, GDM A1 and Anemia                                                                                        Post partum procedures:IV Feraheme  Augmentation: None  Complications: None  Hospital course:  Sceduled C/S   23y.o. yo G1P1001 at 360w0das admitted to the hospital 04/23/2018 for scheduled cesarean section with the following indication:Active HSV. Patient did not have any visible HSV lesions on external exam, however given timing of recent outbreak patient was scheduled for c-section with prophylactic IV Cefazolin given 1 hour prior to incision.   Membrane Rupture Time/Date: 12:01 PM ,04/23/2018   Patient delivered a Viable infant.04/23/2018  Details of operation can be found in separate operative note.  Pateint had an uncomplicated postpartum course.  She is ambulating, tolerating a regular diet, passing flatus, and urinating well. Patient is discharged home in stable condition on  04/26/18. Patient received IV Feraheme transfusion for anemia. She denied any symptoms of anemia at time of discharge. She is to continue oral iron upon discharge.         Magnesium Sulfate recieved: No BMZ received: No  Physical exam  Vitals:   04/25/18 0556 04/25/18 1406 04/25/18 2109 04/26/18 0600  BP: 113/69 105/61 107/67 111/73  Pulse: 67 81 73 63  Resp: _0 Temp: 98.2 F (36.8 C) 98.6 F (37 C) 98.2 F (36.8  C) 98.6 F (37 C)  TempSrc: Oral Oral Oral Oral  SpO2:   100%   Weight:      Height:       General: alert, cooperative and no distress Lochia: appropriate Uterine Fundus: firm Incision: Healing well with no significant drainage, Dressing is clean, dry, and intact DVT Evaluation: No evidence of DVT seen on physical exam. Negative Homan's sign. No significant calf/ankle edema. Labs: Lab Results  Component Value Date   WBC 12.9 (H) 04/24/2018   HGB 8.2 (L) 04/24/2018   HCT 24.7 (L) 04/24/2018   MCV 95.7 04/24/2018   PLT 181 04/24/2018   CMP Latest Ref Rng & Units 04/24/2018  Glucose 70 - 99 mg/dL -  BUN 6 - 20 mg/dL -  Creatinine 0.44 - 1.00 mg/dL 0.73  Sodium 135 - 145 mmol/L -  Potassium 3.5 - 5.1 mmol/L -  Chloride 98 - 111 mmol/L -  CO2 22 - 32 mmol/L -  Calcium 8.9 - 10.3 mg/dL -  Total Protein 6.5 - 8.1 g/dL -  Total Bilirubin 0.3 - 1.2 mg/dL -  Alkaline Phos 38 - 126 U/L -  AST 15 - 41  U/L -  ALT 0 - 44 U/L -    Discharge instruction: per After Visit Summary and "Baby and Me Booklet".  After visit meds:  Allergies as of 04/26/2018   No Known Allergies     Medication List    STOP taking these medications   ACCU-CHEK FASTCLIX LANCETS Misc   ACCU-CHEK NANO SMARTVIEW w/Device Kit   glucose blood test strip Commonly known as:  ACCU-CHEK SMARTVIEW   valACYclovir 500 MG tablet Commonly known as:  VALTREX     TAKE these medications   ibuprofen 600 MG tablet Commonly known as:  ADVIL,MOTRIN Take 1 tablet (600 mg total) by mouth every 6 (six) hours as needed for moderate pain.   oxyCODONE-acetaminophen 5-325 MG tablet Commonly known as:  PERCOCET Take 1 tablet by mouth every 4 (four) hours as needed for severe pain.   prenatal multivitamin Tabs tablet Take 1 tablet by mouth daily at 12 noon.   senna-docusate 8.6-50 MG tablet Commonly known as:  Senokot-S Take 2 tablets by mouth at bedtime as needed for mild constipation.       Diet: routine  diet  Activity: Advance as tolerated. Pelvic rest for 6 weeks.   Outpatient follow up:4 weeks Follow up Appt: Future Appointments  Date Time Provider Department Center  04/29/2018 10:30 AM Arnold, James G, MD [facility] None  06/02/2018  3:00 PM Arnold, James G, MD [facility] None   Follow up Visit: Follow-up Information    [facility]. Go on 04/29/2018.   Why:  at 10:30am for incision check. Contact information: [address] [place] [address] [phone]       Arnold, James G, MD. Go on 06/02/2018.   Specialty:  Obstetrics and Gynecology Why:  at 3:00PM for 4-week postpartum visit Contact information: [address] [place] [address] [phone]            Please schedule this patient for Postpartum visit in: 4 weeks with the following provider: Any provider For C/S patients schedule nurse incision check in weeks 2 weeks: yes High risk pregnancy complicated by: diet control GDM and HSV anogenital infection Delivery mode:  CS Anticipated Birth Control:  POPs PP Procedures needed: Incision check, PP CBC for anemia s/p Feraheme  Schedule Integrated BH visit: no   Newborn Data: Live born female  Birth Weight: 5 lb 14 oz (2665 g) APGAR: 9, 9  Newborn Delivery   Birth date/time:  04/23/2018 12:01:00 Delivery type:  C-Section, Low Transverse Trial of labor:  No C-section categorization:  Primary     Baby Feeding: Breast Disposition:NICU - Receiving IV Acyclovir    04/26/2018 Jacob J Stinson, DO   

## 2018-04-25 NOTE — Progress Notes (Signed)
Patient screened out for psychosocial assessment since none of the following apply: °Psychosocial stressors documented in mother or baby's chart °Gestation less than 32 weeks °Code at delivery  °Infant with anomalies °Please contact the Clinical Social Worker if specific needs arise, by MOB's request, or if MOB scores greater than 9/yes to question 10 on Edinburgh Postpartum Depression Screen. ° °Kayla Liu, MSW, LCSW °Clinical Social Work °[phone] °  °

## 2018-04-27 ENCOUNTER — Ambulatory Visit: Payer: Self-pay

## 2018-04-27 NOTE — Lactation Note (Addendum)
This note was copied from a baby's chart. Lactation Consultation Note  Patient Name: Boy [place] QMGNO'I Date: 04/27/2018 Reason for consult: Follow-up assessment;Primapara;1st time breastfeeding;NICU baby;Term;Infant < 6lbs Type of Endocrine Disorder?: Diabetes  Visited with P62 Mom of 4 day old baby in the NICU.  Hoping to be discharged today.  Mom using hand pump (her preference) and expressing 4 oz per pumping session.  Has a [employer] pump at home.  Mom pumping right breast, and baby started waking up while we were talking.  Offered to assist with latching and positioning baby, and Mom accepted.  She stopped pumping.  Baby positioned in football hold on left breast.  Mom with large, full breasts and erect nipple.  Assisted Mom in supporting her breast, and securely supporting baby's head.  Demonstrated how to stimulate baby's upper lip with her nipple.  Baby opened enough for nipple, but then opened very wide.  Assisted Mom in bringing baby quickly to breast.  Baby started with a nutritive suck/swallow pattern.  Taught Mom how to use alternate breast compression to increase milk transfer.  Regular swallows identified for Mom.  Baby feeding 30 mins when [place] left room.  Left breast notably softer.  Encouraged keeping baby STS as much as she can, offering the breast with cues.  Goal of 8-12 feedings per 24 hrs reviewed.  Mom interested in OP lactation follow-up.  Request made for next week.  Mom aware of OP lactation support available.     Consult Status Consult Status: Follow-up Date: 04/27/18 Follow-up type: [place], Jolyne Laye E 04/27/2018, 2:24 PM

## 2018-04-29 ENCOUNTER — Ambulatory Visit: Payer: Medicaid Other | Admitting: Obstetrics & Gynecology

## 2018-05-02 ENCOUNTER — Ambulatory Visit: Payer: Medicaid Other | Admitting: Obstetrics

## 2018-05-03 ENCOUNTER — Encounter: Payer: Self-pay | Admitting: Obstetrics

## 2018-05-03 ENCOUNTER — Ambulatory Visit (INDEPENDENT_AMBULATORY_CARE_PROVIDER_SITE_OTHER): Payer: Medicaid Other | Admitting: Obstetrics

## 2018-05-03 ENCOUNTER — Other Ambulatory Visit: Payer: Self-pay

## 2018-05-03 DIAGNOSIS — Z9889 Other specified postprocedural states: Secondary | ICD-10-CM

## 2018-05-03 NOTE — Progress Notes (Signed)
Presents for Incision Check.

## 2018-05-04 ENCOUNTER — Encounter: Payer: Self-pay | Admitting: Obstetrics

## 2018-05-04 NOTE — Progress Notes (Signed)
Subjective:     Kayla Liu is a 23 y.o. female who presents for a postpartum visit. She is 1 week postpartum following a low cervical transverse Cesarean section. I have fully reviewed the prenatal and intrapartum course. The delivery was at 36 gestational weeks for active HSV outbreak. Outcome: primary cesarean section, low transverse incision. Anesthesia: spinal. Postpartum course has been normal. Baby's course has been normal.  Bleeding thin lochia. Bowel function is normal. Bladder function is normal. Patient is not sexually active. Contraception method is abstinence. Postpartum depression screening: negative.  Tobacco, alcohol and substance abuse history reviewed.  Adult immunizations reviewed including TDAP, rubella and varicella.  The following portions of the patient's history were reviewed and updated as appropriate: allergies, current medications, past family history, past medical history, past social history, past surgical history and problem list.  Review of Systems A comprehensive review of systems was negative.   Objective:    BP 122/77   Pulse 77   Ht 4\' 11"  (1.499 m)   Wt 144 lb 6.4 oz (65.5 kg)   Breastfeeding Yes   BMI 29.17 kg/m   General:  alert and no distress   Breasts:  inspection negative, no nipple discharge or bleeding, no masses or nodularity palpable  Lungs: clear to auscultation bilaterally  Heart:  regular rate and rhythm, S1, S2 normal, no murmur, click, rub or gallop  Abdomen: soft, non-tender; bowel sounds normal; no masses,  no organomegaly and incision is clean, dry, intact and non tender    50% of 15 min visit spent on counseling and coordination of care.   Assessment:     1. Postpartum care following cesarean delivery  Plan:    1. Contraception: considering options 2. Continue PNV's 3. Follow up in: 2 weeks or as needed.   Shelly Bombard MD 05-04-2018

## 2018-06-02 ENCOUNTER — Ambulatory Visit: Payer: Medicaid Other | Admitting: Obstetrics

## 2019-06-09 IMAGING — US US MFM OB FOLLOW-UP
1 series · 14 of 28 positions shown · non-contrast
Comparison: none

[Series 1: us mfm ob follow-up · 48 acquisitions, 14 frames shown]
[im 2/48]
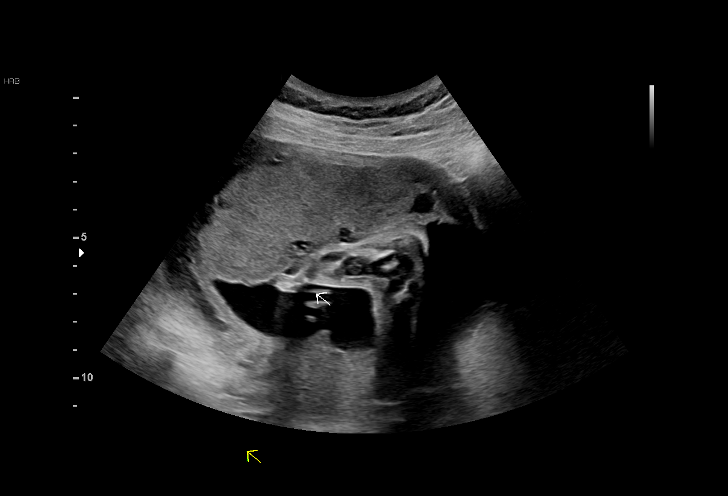
[im 6/48]
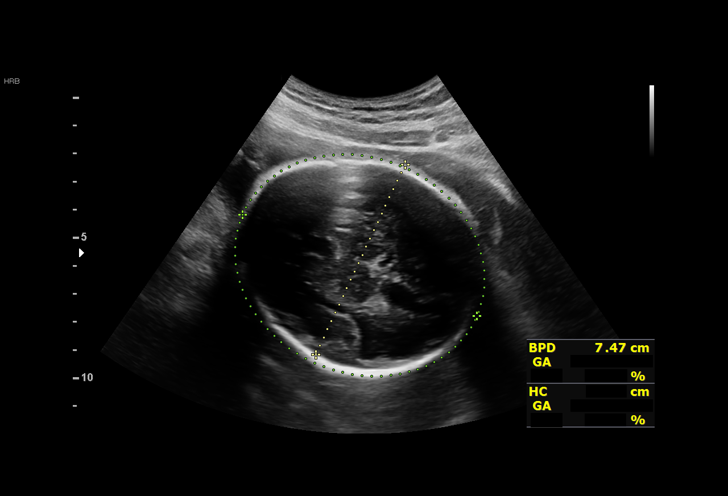
[im 9/48]
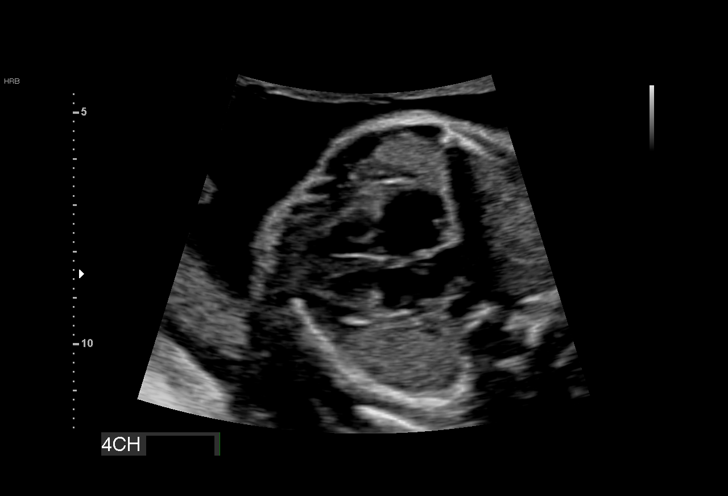
[im 13/48]
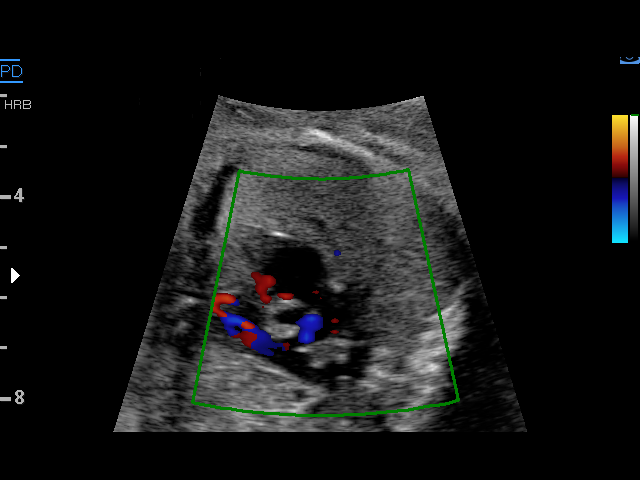
[im 16/48]
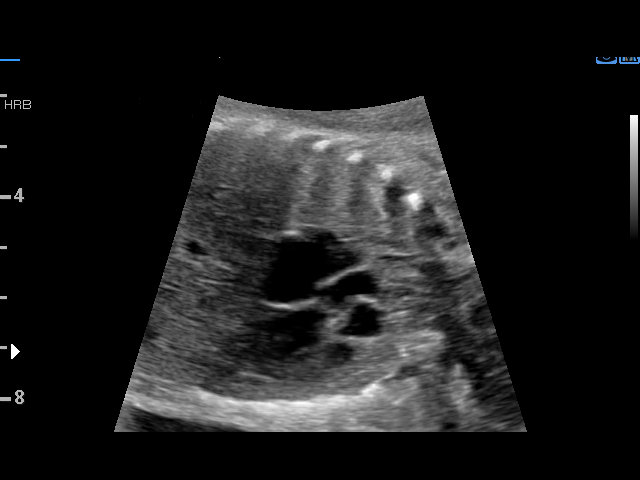
[im 20/48]
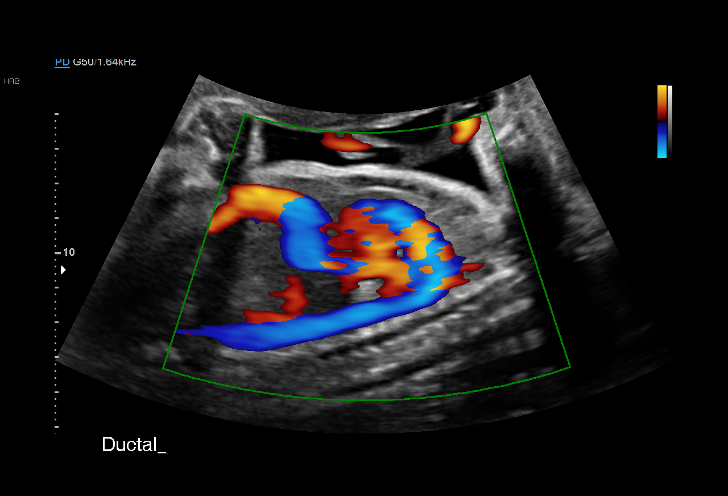
[im 23/48]
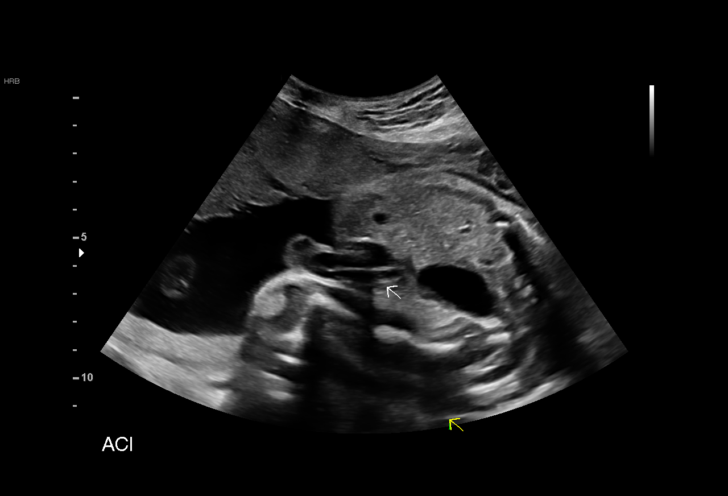
[im 27/48]
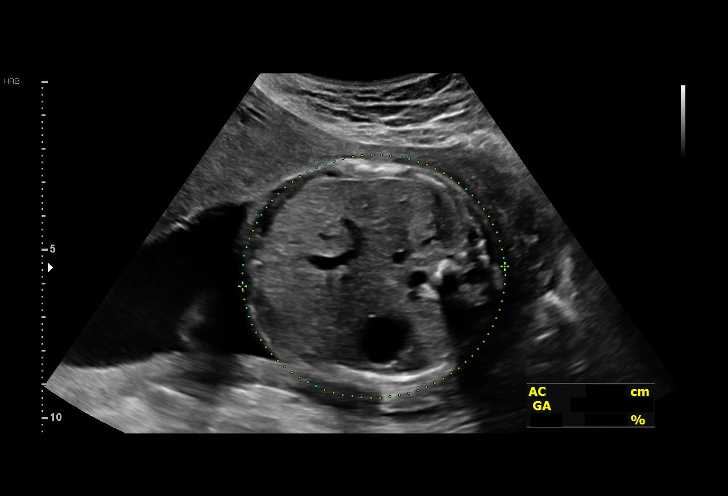
[im 30/48]
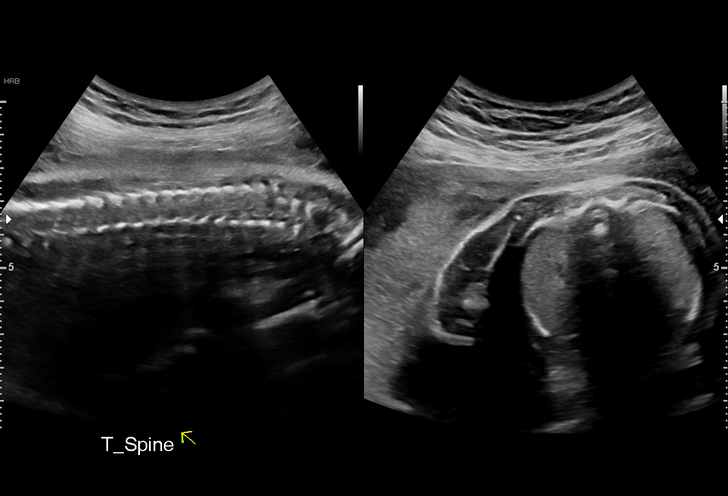
[im 34/48]
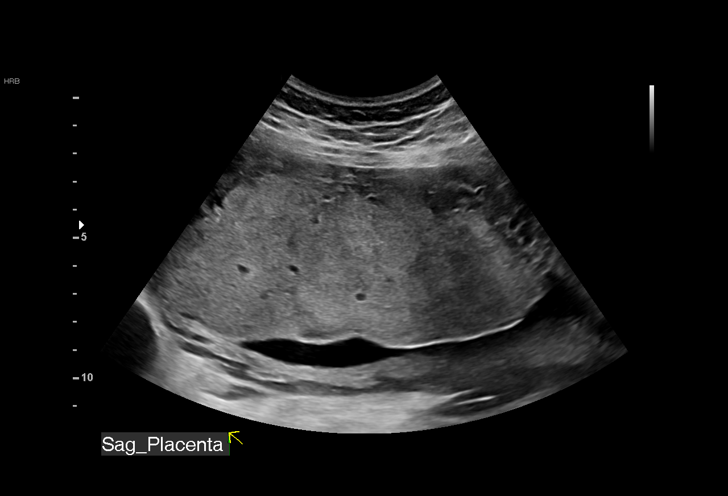
[im 37/48]
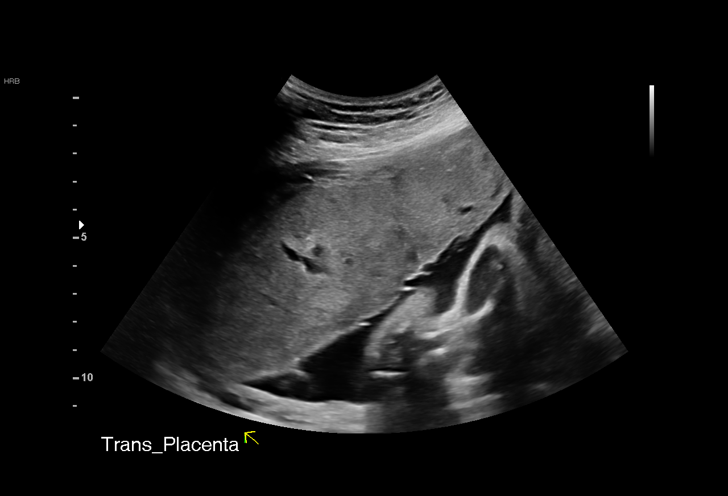
[im 41/48]
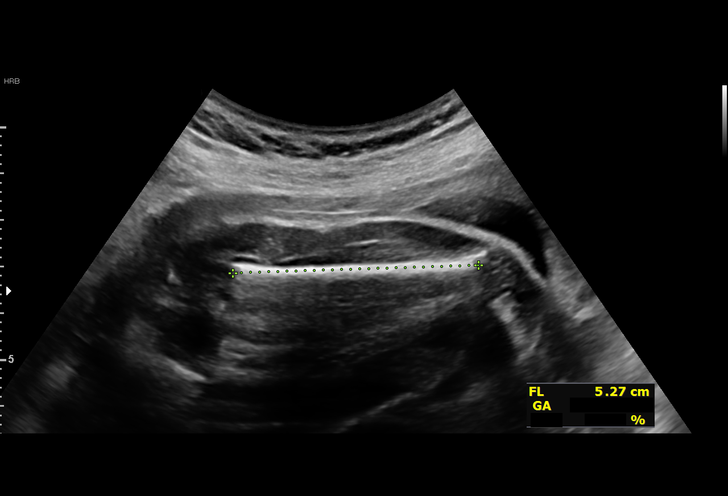
[im 44/48]
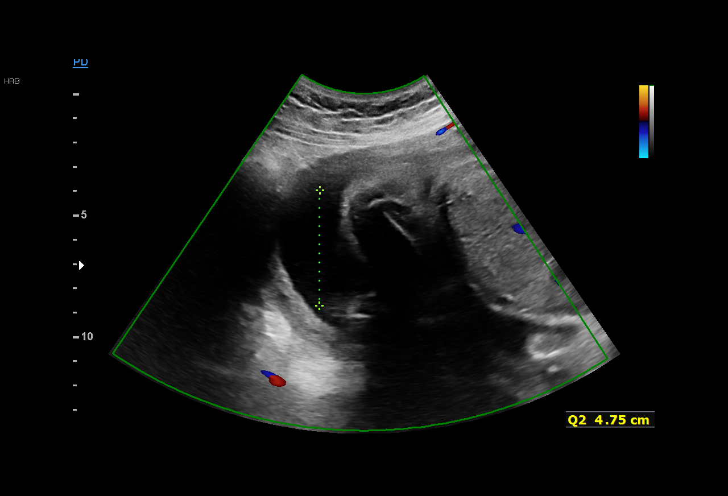
[im 48/48]
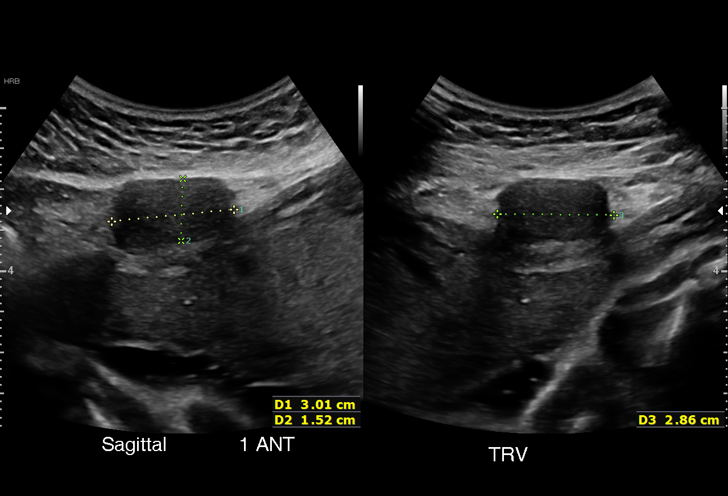

[14 of 28 positions shown; findings below may reference images not displayed]

[REDACTED]care -
                                                             [REDACTED]
 Ref. Address:     Faculty

 ----------------------------------------------------------------------

 ----------------------------------------------------------------------
Indications

  Uterine fibroids
  Encounter for other antenatal screening
  follow-up
  28 weeks gestation of pregnancy
 ----------------------------------------------------------------------
Vital Signs

 BMI:
Fetal Evaluation

 Num Of Fetuses:          1
 Cardiac Activity:        Observed
 Presentation:            Cephalic
 Placenta:                Anterior
 P. Cord Insertion:       Previously Visualized

 Amniotic Fluid
 AFI FV:      Within normal limits

 AFI Sum(cm)     %Tile       Largest Pocket(cm)
 13.78           43

 RUQ(cm)       RLQ(cm)       LUQ(cm)        LLQ(cm)

Biometry

 BPD:      74.7  mm     G. Age:  30w 0d         87  %    CI:        79.99   %    70 - 86
                                                         FL/HC:       19.2  %    18.8 -
 HC:      263.9  mm     G. Age:  28w 5d         32  %    HC/AC:       1.07       1.05 -
 AC:      245.8  mm     G. Age:  28w 5d         59  %    FL/BPD:      67.9  %    71 - 87
 FL:       50.7  mm     G. Age:  27w 1d         10  %    FL/AC:       20.6  %    20 - 24
 LV:        5.1  mm

 Est. FW:    0203   gm   2 lb 11 oz      52  %
OB History

 Gravidity:    1
Gestational Age

 LMP:           31w 0d        Date:  07/05/17                 EDD:   04/11/18
 U/S Today:     28w 5d                                        EDD:   04/27/18
 Best:          28w 2d     Det. By:  U/S  (01/10/18)          EDD:   04/30/18
Anatomy

 Cranium:               Appears normal         Aortic Arch:            Appears normal
 Cavum:                 Appears normal         Ductal Arch:            Appears normal
 Ventricles:            Appears normal         Diaphragm:              Appears normal
 Choroid Plexus:        Previously seen        Stomach:                Appears normal, left
                                                                       sided
 Cerebellum:            Previously seen        Abdomen:                Appears normal
 Posterior Fossa:       Previously seen        Abdominal Wall:         Appears nml (cord
                                                                       insert, abd wall)
 Nuchal Fold:           Not applicable (>20    Cord Vessels:           Previously seen
                        wks GA)
 Face:                  Orbits and profile     Kidneys:                Previously seen
                        previously seen
 Lips:                  Previously seen        Bladder:                Appears normal
 Thoracic:              Appears normal         Spine:                  Ltd views no
                                                                       intracranial signs of
                                                                       NT
 Heart:                 Appears normal         Upper Extremities:      Previously seen
                        (4CH, axis, and situs
 RVOT:                  Appears normal         Lower Extremities:      Previously seen
 LVOT:                  Appears normal

 Other:  Male gender. Heels and 5th digit previously visualized.
Cervix Uterus Adnexa

 Cervix
 Not visualized (advanced GA >05wks)

 Uterus
 Single fibroid noted, see table below.
Myomas

  Site                     L(cm)      W(cm)      D(cm)       Location
  Anterior
 ----------------------------------------------------------------------
  Blood Flow                 RI        PI       Comments

 ----------------------------------------------------------------------
Impression

 Normal interval growth.
Recommendations

 Follow up as clinically indicated.

## 2019-06-26 ENCOUNTER — Emergency Department (HOSPITAL_COMMUNITY)
Admission: EM | Admit: 2019-06-26 | Discharge: 2019-06-26 | Disposition: A | Discharge status: Home / Self Care | Payer: Medicaid Other | Attending: Emergency Medicine | Admitting: Emergency Medicine

## 2019-06-26 ENCOUNTER — Encounter (HOSPITAL_COMMUNITY): Payer: Self-pay

## 2019-06-26 ENCOUNTER — Other Ambulatory Visit: Payer: Self-pay

## 2019-06-26 DIAGNOSIS — F419 Anxiety disorder, unspecified: Secondary | ICD-10-CM | POA: Diagnosis not present

## 2019-06-26 DIAGNOSIS — F1123 Opioid dependence with withdrawal: Secondary | ICD-10-CM | POA: Diagnosis not present

## 2019-06-26 DIAGNOSIS — R110 Nausea: Secondary | ICD-10-CM | POA: Diagnosis not present

## 2019-06-26 DIAGNOSIS — F1193 Opioid use, unspecified with withdrawal: Secondary | ICD-10-CM

## 2019-06-26 MED ORDER — NAPROXEN 375 MG PO TABS: 375.0000 mg | ORAL_TABLET | Freq: Three times a day (TID) | ORAL | 0 refills | Status: DC

## 2019-06-26 MED ORDER — ONDANSETRON HCL 4 MG PO TABS: 4.0000 mg | ORAL_TABLET | Freq: Four times a day (QID) | ORAL | 0 refills | Status: DC | PRN

## 2019-06-26 MED ORDER — IBUPROFEN 200 MG PO TABS
600.0000 mg | ORAL_TABLET | Freq: Once | ORAL | Status: AC
  Administered 2019-06-26: 600 mg via ORAL
  Filled 2019-06-26: qty 3

## 2019-06-26 MED ORDER — CLONIDINE HCL 0.1 MG PO TABS
0.1000 mg | ORAL_TABLET | Freq: Once | ORAL | Status: AC
  Administered 2019-06-26: 18:00:00 0.1 mg via ORAL
  Filled 2019-06-26: qty 1

## 2019-06-26 MED ORDER — DICYCLOMINE HCL 20 MG PO TABS: 20.0000 mg | ORAL_TABLET | Freq: Four times a day (QID) | ORAL | 0 refills | Status: DC | PRN

## 2019-06-26 NOTE — ED Provider Notes (Signed)
[facility] DEPT Provider Note   CSN: [id number] Arrival date & time: 06/26/19  1437     History Chief Complaint  Patient presents with  . detox    Kayla Liu is a 24 y.o. female.  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  Last used very early this morning.  She states that she is trying to get into a detox program and is requesting medications to help with her withdrawal symptoms.  Getting to feel somewhat anxious.  Shaky.  Mild nausea.  No suicidal homicidal ideations.  No other acute complaints.  Past Medical History:  Diagnosis Date  . Gestational diabetes   . HSV (herpes simplex virus) infection 2019    Patient Active Problem List   Diagnosis Date Noted  . Postpartum anemia 04/24/2018  . No blood products 04/23/2018  . Status post cesarean delivery 04/23/2018  . HSV (herpes simplex virus) anogenital infection 04/09/2018  . Gestational diabetes mellitus (GDM), antepartum 01/12/2018  . Supervision of normal pregnancy, antepartum 10/19/2017    Past Surgical History:  Procedure Laterality Date  . CESAREAN SECTION N/A 04/23/2018   Procedure: CESAREAN SECTION;  Surgeon: Aletha Halim, MD;  Location: [facility] LD ORS;  Service: Obstetrics;  Laterality: N/A;  . NO PAST SURGERIES       OB History    Gravida  1   Para  1   Term  1   Preterm      AB      Living  1     SAB      TAB      Ectopic      Multiple  0   Live Births  1           Family History  Problem Relation Age of Onset  . Hypertension Maternal Aunt   . Healthy Mother   . Healthy Father     Social History   Tobacco Use  . Smoking status: Never Smoker  . Smokeless tobacco: Never Used  Substance Use Topics  . Alcohol use: No  . Drug use: Yes    Comment: opiate use    Home Medications Prior to Admission medications   Medication Sig Start Date End Date Taking? Authorizing Provider  dicyclomine (BENTYL) 20 MG tablet Take 1  tablet (20 mg total) by mouth every 6 (six) hours as needed for spasms (abdominal cramps). 06/26/19   Virgel Manifold, MD  ibuprofen (ADVIL,MOTRIN) 600 MG tablet Take 1 tablet (600 mg total) by mouth every 6 (six) hours as needed for moderate pain. 04/25/18   Mullis, Kiersten P, DO  naproxen (NAPROSYN) 375 MG tablet Take 1 tablet (375 mg total) by mouth 3 (three) times daily with meals. 06/26/19   Virgel Manifold, MD  ondansetron (ZOFRAN) 4 MG tablet Take 1 tablet (4 mg total) by mouth every 6 (six) hours as needed for nausea or vomiting. 06/26/19   Virgel Manifold, MD  Prenatal Vit-Fe Fumarate-FA (PRENATAL MULTIVITAMIN) TABS tablet Take 1 tablet by mouth daily at 12 noon.    [provider]  senna-docusate (SENOKOT-S) 8.6-50 MG tablet Take 2 tablets by mouth at bedtime as needed for mild constipation. 04/25/18   Mullis, Kiersten P, DO    Allergies    Patient has no known allergies.  Review of Systems   Review of Systems All systems reviewed and negative, other than as noted in HPI.  Physical Exam Updated Vital Signs BP (!) 121/98 (BP Location: Left Arm)   Pulse Marland Kitchen)  104   Temp 98.2 F (36.8 C) (Oral)   Resp 16   Ht 4\' 10"  (1.473 m)   Wt 60.8 kg   LMP 05/30/2019   SpO2 100%   BMI 28.01 kg/m   Physical Exam Vitals and nursing note reviewed.  Constitutional:      General: She is not in acute distress.    Appearance: She is well-developed.     Comments: Tearful but in no acute distress otherwise.  HENT:     Head: Normocephalic and atraumatic.  Eyes:     General:        Right eye: No discharge.        Left eye: No discharge.     Conjunctiva/sclera: Conjunctivae normal.  Cardiovascular:     Rate and Rhythm: Normal rate and regular rhythm.     Heart sounds: Normal heart sounds. No murmur. No friction rub. No gallop.   Pulmonary:     Effort: Pulmonary effort is normal. No respiratory distress.     Breath sounds: Normal breath sounds.  Abdominal:     General: There is no  distension.     Palpations: Abdomen is soft.     Tenderness: There is no abdominal tenderness.  Musculoskeletal:        General: No tenderness.     Cervical back: Neck supple.  Skin:    General: Skin is warm and dry.  Neurological:     Mental Status: She is alert.  Psychiatric:        Behavior: Behavior normal.        Thought Content: Thought content normal.     ED Results / Procedures / Treatments   Labs (all labs ordered are listed, but only abnormal results are displayed) Labs Reviewed - No data to display  EKG None  Radiology No results found.  Procedures Procedures (including critical care time)  Medications Ordered in ED Medications - No data to display  ED Course  I have reviewed the triage vital signs and the nursing notes.  Pertinent labs & imaging results that were available during my care of the patient were reviewed by me and considered in my medical decision making (see chart for details).    MDM Rules/Calculators/A&P                      24 year old female with a history of opiate abuse and withdrawal symptoms.  Meds prescribed to help with her symptoms.  Resource list provided.  Final Clinical Impression(s) / ED Diagnoses Final diagnoses:  Opiate withdrawal ([place])    Rx / DC Orders ED Discharge Orders         Ordered    ondansetron (ZOFRAN) 4 MG tablet  Every 6 hours PRN     06/26/19 1748    dicyclomine (BENTYL) 20 MG tablet  Every 6 hours PRN     06/26/19 1748    naproxen (NAPROSYN) 375 MG tablet  3 times daily with meals     06/26/19 1748           Virgel Manifold, MD 06/26/19 1753

## 2019-06-26 NOTE — ED Triage Notes (Signed)
Patient states she is in the process of moving and needs a prescription to get her through until she gets to detox next week.

## 2019-08-11 ENCOUNTER — Encounter (HOSPITAL_COMMUNITY): Payer: Self-pay | Admitting: *Deleted

## 2019-08-11 ENCOUNTER — Encounter (HOSPITAL_COMMUNITY): Payer: Self-pay | Admitting: Emergency Medicine

## 2019-08-11 ENCOUNTER — Ambulatory Visit (HOSPITAL_COMMUNITY)
Admission: EM | Admit: 2019-08-11 | Discharge: 2019-08-11 | Disposition: A | Discharge status: Home / Self Care | Payer: Medicaid Other | Attending: Family Medicine | Admitting: Family Medicine

## 2019-08-11 ENCOUNTER — Emergency Department (HOSPITAL_COMMUNITY)
Admission: EM | Admit: 2019-08-11 | Discharge: 2019-08-11 | Discharge status: Against Medical Advice | Payer: Medicaid Other | Attending: Emergency Medicine | Admitting: Emergency Medicine

## 2019-08-11 ENCOUNTER — Other Ambulatory Visit: Payer: Self-pay

## 2019-08-11 DIAGNOSIS — H9209 Otalgia, unspecified ear: Secondary | ICD-10-CM | POA: Insufficient documentation

## 2019-08-11 DIAGNOSIS — J3489 Other specified disorders of nose and nasal sinuses: Secondary | ICD-10-CM | POA: Insufficient documentation

## 2019-08-11 DIAGNOSIS — R05 Cough: Secondary | ICD-10-CM | POA: Diagnosis present

## 2019-08-11 DIAGNOSIS — H9201 Otalgia, right ear: Secondary | ICD-10-CM | POA: Diagnosis not present

## 2019-08-11 DIAGNOSIS — Z20822 Contact with and (suspected) exposure to covid-19: Secondary | ICD-10-CM | POA: Diagnosis not present

## 2019-08-11 DIAGNOSIS — H6120 Impacted cerumen, unspecified ear: Secondary | ICD-10-CM | POA: Diagnosis not present

## 2019-08-11 DIAGNOSIS — J069 Acute upper respiratory infection, unspecified: Secondary | ICD-10-CM | POA: Diagnosis not present

## 2019-08-11 DIAGNOSIS — J029 Acute pharyngitis, unspecified: Secondary | ICD-10-CM | POA: Insufficient documentation

## 2019-08-11 DIAGNOSIS — Z5321 Procedure and treatment not carried out due to patient leaving prior to being seen by health care provider: Secondary | ICD-10-CM | POA: Insufficient documentation

## 2019-08-11 MED ORDER — FLUOCINOLONE ACETONIDE 0.01 % OT OIL: 1.0000 [drp] | TOPICAL_OIL | Freq: Two times a day (BID) | OTIC | 0 refills | Status: AC

## 2019-08-11 MED ORDER — CETIRIZINE HCL 10 MG PO TABS: 10.0000 mg | ORAL_TABLET | Freq: Every day | ORAL | 0 refills | Status: DC

## 2019-08-11 MED ORDER — FLUTICASONE PROPIONATE 50 MCG/ACT NA SUSP: 1.0000 | Freq: Every day | NASAL | 0 refills | Status: DC

## 2019-08-11 MED ORDER — BENZONATATE 100 MG PO CAPS: 100.0000 mg | ORAL_CAPSULE | Freq: Three times a day (TID) | ORAL | 0 refills | Status: DC

## 2019-08-11 NOTE — ED Triage Notes (Signed)
Pt c/o sore throat, stuffy nose and ear pain x 2 days, states she threw up a little this morning

## 2019-08-11 NOTE — ED Triage Notes (Signed)
Sore throat, runny nose for 2 days.

## 2019-08-11 NOTE — ED Provider Notes (Signed)
[place]    CSN: [id number] Arrival date & time: 08/11/19  1912      History   Chief Complaint Chief Complaint  Patient presents with  . Cough  . Nasal Congestion    HPI Kayla Liu is a 24 y.o. female.   Patient presents with a 2-day history of nasal congestion, cough and development of right ear pain.  She reports symptoms started with nasal congestion and a sore throat.  Sore throat is since improved somewhat.  However since then she has developed a dry cough that is worse in the morning.  Denies shortness of breath.  She is also had some right ear pain develop and feels like she cannot hear as well out of it.  Denies any drainage from the ear.  Patient denies significant headache.  Denies nausea, vomiting or diarrhea.  No change in taste or smell.  Her partner who she lives with is also here being tested for Covid and is symptomatic.       Past Medical History:  Diagnosis Date  . Gestational diabetes   . HSV (herpes simplex virus) infection 2019    Patient Active Problem List   Diagnosis Date Noted  . Postpartum anemia 04/24/2018  . No blood products 04/23/2018  . Status post cesarean delivery 04/23/2018  . HSV (herpes simplex virus) anogenital infection 04/09/2018  . Gestational diabetes mellitus (GDM), antepartum 01/12/2018  . Supervision of normal pregnancy, antepartum 10/19/2017    Past Surgical History:  Procedure Laterality Date  . CESAREAN SECTION N/A 04/23/2018   Procedure: CESAREAN SECTION;  Surgeon: Aletha Halim, MD;  Location: [facility] LD [facility];  Service: Obstetrics;  Laterality: N/A;  . NO PAST SURGERIES      OB History    Gravida  1   Para  1   Term  1   Preterm      AB      Living  1     SAB      TAB      Ectopic      Multiple  0   Live Births  1            Home Medications    Prior to Admission medications   Medication Sig Start Date End Date Taking? Authorizing Provider  benzonatate (TESSALON) 100 MG  capsule Take 1 capsule (100 mg total) by mouth every 8 (eight) hours. 08/11/19   Aletheia Tangredi, Marguerita Beards, PA-C  cetirizine (ZYRTEC ALLERGY) 10 MG tablet Take 1 tablet (10 mg total) by mouth daily. 08/11/19   Rahmir Beever, Marguerita Beards, PA-C  dicyclomine (BENTYL) 20 MG tablet Take 1 tablet (20 mg total) by mouth every 6 (six) hours as needed for spasms (abdominal cramps). 06/26/19   Virgel Manifold, MD  Fluocinolone Acetonide (DERMOTIC) 0.01 % OIL Place 1 drop in ear(s) in the morning and at bedtime for 3 days. 08/11/19 08/14/19  Sharah Finnell, Marguerita Beards, PA-C  fluticasone (FLONASE) 50 MCG/ACT nasal spray Place 1 spray into both nostrils daily. 08/11/19   Abisai Coble, Marguerita Beards, PA-C  ibuprofen (ADVIL,MOTRIN) 600 MG tablet Take 1 tablet (600 mg total) by mouth every 6 (six) hours as needed for moderate pain. 04/25/18   Mullis, Kiersten P, DO  naproxen (NAPROSYN) 375 MG tablet Take 1 tablet (375 mg total) by mouth 3 (three) times daily with meals. 06/26/19   Virgel Manifold, MD  ondansetron (ZOFRAN) 4 MG tablet Take 1 tablet (4 mg total) by mouth every 6 (six) hours as needed for nausea  or vomiting. 06/26/19   Virgel Manifold, MD  Prenatal Vit-Fe Fumarate-FA (PRENATAL MULTIVITAMIN) TABS tablet Take 1 tablet by mouth daily at 12 noon.    [provider]  senna-docusate (SENOKOT-S) 8.6-50 MG tablet Take 2 tablets by mouth at bedtime as needed for mild constipation. 04/25/18   Mullis, Archie Endo, DO    Family History Family History  Problem Relation Age of Onset  . Hypertension Maternal Aunt   . Healthy Mother   . Healthy Father     Social History Social History   Tobacco Use  . Smoking status: Never Smoker  . Smokeless tobacco: Never Used  Vaping Use  . Vaping Use: Never used  Substance Use Topics  . Alcohol use: No  . Drug use: Yes    Comment: opiate use     Allergies   Patient has no known allergies.   Review of Systems Review of Systems   Physical Exam Triage Vital Signs ED Triage Vitals  Enc Vitals Group     BP 08/11/19  1924 123/64     Pulse Rate 08/11/19 1924 84     Resp 08/11/19 1924 18     Temp 08/11/19 1924 98.3 F (36.8 C)     Temp Source 08/11/19 1924 Oral     SpO2 08/11/19 1924 100 %     Weight --      Height --      Head Circumference --      Peak Flow --      Pain Score 08/11/19 1926 4     Pain Loc --      Pain Edu? --      Excl. in [place]? --    No data found.  Updated Vital Signs BP 123/64   Pulse 84   Temp 98.3 F (36.8 C) (Oral)   Resp 18   LMP 08/03/2019   SpO2 100%   Visual Acuity Right Eye Distance:   Left Eye Distance:   Bilateral Distance:    Right Eye Near:   Left Eye Near:    Bilateral Near:     Physical Exam Vitals and nursing note reviewed.  Constitutional:      General: She is not in acute distress.    Appearance: She is well-developed. She is not ill-appearing.  HENT:     Head: Normocephalic and atraumatic.     Ears:     Comments: Right ear with large amount of cerumen in ear canal, easily removed with curette.  Mild irritation and injection of the canal following removal with some tenderness.  Tympanic membranes bilaterally with serous fluid  No pain with manipulation of either tragus or auricle    Nose: Congestion and rhinorrhea present.     Mouth/Throat:     Mouth: Mucous membranes are moist.     Pharynx: No oropharyngeal exudate.     Comments: Postnasal drip in the oropharynx.  Minimal erythema present Eyes:     Conjunctiva/sclera: Conjunctivae normal.     Pupils: Pupils are equal, round, and reactive to light.  Cardiovascular:     Rate and Rhythm: Normal rate and regular rhythm.     Heart sounds: No murmur heard.   Pulmonary:     Effort: Pulmonary effort is normal. No respiratory distress.     Breath sounds: Normal breath sounds. No wheezing, rhonchi or rales.  Abdominal:     Palpations: Abdomen is soft.     Tenderness: There is no abdominal tenderness.  Musculoskeletal:  Cervical back: Neck supple.  Lymphadenopathy:     Cervical: No  cervical adenopathy.  Skin:    General: Skin is warm and dry.     Findings: No rash.  Neurological:     Mental Status: She is alert.      UC Treatments / Results  Labs (all labs ordered are listed, but only abnormal results are displayed) Labs Reviewed  SARS CORONAVIRUS 2 (TAT 6-24 HRS)    EKG   Radiology No results found.  Procedures Procedures (including critical care time)  Curette removal of earwax in right ear. Medications Ordered in UC Medications - No data to display  Initial Impression / Assessment and Plan / UC Course  I have reviewed the triage vital signs and the nursing notes.  Pertinent labs & imaging results that were available during my care of the patient were reviewed by me and considered in my medical decision making (see chart for details).     #Viral URI #Cerumen in auditory canal #Otalgia  patient is a 24 year old presenting with viral URI symptoms and otalgia.  Otalgia is likely driven both by serous otitis from eustachian tube dysfunction as well as seeing amount of cerumen in the right ear.  Given injection and irritation from removal will place on a short course of Dermotic eardrops to ease irritation.  Flonase and Zyrtec for nasal signs and symptoms and eustachian tube dysfunction.  Tessalon for cough.  Return in emergency department precautions were discussed.  Covid PCR sent.  Patient verbalized understanding plan of care. Final Clinical Impressions(s) / UC Diagnoses   Final diagnoses:  Viral upper respiratory tract infection  Cerumen in auditory canal on examination  Right ear pain     Discharge Instructions     Take the tessalon for cough every 8 hours Flonase daily Zyrtec daily  Use ear drops for a few days as needed for ear canal discomfort post removal of cerumen  If shortness of breath, high fever or severe symptoms return or go to ED  If your Covid-19 test is positive, you will receive a phone call from [facility]  regarding your results. Negative test results are not called. Both positive and negative results area always visible on MyChart. If you do not have a MyChart account, sign up instructions are in your discharge papers.   Persons who are directed to care for themselves at home may discontinue isolation under the following conditions:  . At least 10 days have passed since symptom onset and . At least 24 hours have passed without running a fever (this means without the use of fever-reducing medications) and . Other symptoms have improved.  Persons infected with COVID-19 who never develop symptoms may discontinue isolation and other precautions 10 days after the date of their first positive COVID-19 test.       ED Prescriptions    Medication Sig Dispense Auth. Provider   fluticasone (FLONASE) 50 MCG/ACT nasal spray Place 1 spray into both nostrils daily. 11.1 mL Eviana Sibilia, Marguerita Beards, PA-C   cetirizine (ZYRTEC ALLERGY) 10 MG tablet Take 1 tablet (10 mg total) by mouth daily. 30 tablet Caz Weaver, Marguerita Beards, PA-C   benzonatate (TESSALON) 100 MG capsule Take 1 capsule (100 mg total) by mouth every 8 (eight) hours. 21 capsule Taevon Aschoff, Marguerita Beards, PA-C   Fluocinolone Acetonide (DERMOTIC) 0.01 % OIL Place 1 drop in ear(s) in the morning and at bedtime for 3 days. 5 mL Mabrey Howland, Marguerita Beards, PA-C     PDMP not reviewed  this encounter.   Purnell Shoemaker, PA-C 08/12/19 0023

## 2019-08-11 NOTE — ED Notes (Signed)
Turned labels in and left

## 2019-08-11 NOTE — Discharge Instructions (Signed)
Take the tessalon for cough every 8 hours Flonase daily Zyrtec daily  Use ear drops for a few days as needed for ear canal discomfort post removal of cerumen  If shortness of breath, high fever or severe symptoms return or go to ED  If your Covid-19 test is positive, you will receive a phone call from [facility] regarding your results. Negative test results are not called. Both positive and negative results area always visible on MyChart. If you do not have a MyChart account, sign up instructions are in your discharge papers.   Persons who are directed to care for themselves at home may discontinue isolation under the following conditions:   At least 10 days have passed since symptom onset and  At least 24 hours have passed without running a fever (this means without the use of fever-reducing medications) and  Other symptoms have improved.  Persons infected with COVID-19 who never develop symptoms may discontinue isolation and other precautions 10 days after the date of their first positive COVID-19 test.

## 2019-08-12 LAB — SARS CORONAVIRUS 2 (TAT 6-24 HRS): SARS Coronavirus 2: NEGATIVE

## 2020-06-10 ENCOUNTER — Ambulatory Visit
Admission: EM | Admit: 2020-06-10 | Discharge: 2020-06-10 | Disposition: A | Discharge status: Home / Self Care | Payer: Medicaid Other

## 2020-06-10 ENCOUNTER — Other Ambulatory Visit: Payer: Self-pay

## 2020-06-20 ENCOUNTER — Other Ambulatory Visit: Payer: Self-pay

## 2020-06-20 ENCOUNTER — Ambulatory Visit (INDEPENDENT_AMBULATORY_CARE_PROVIDER_SITE_OTHER): Payer: Medicaid Other

## 2020-06-20 DIAGNOSIS — Z3200 Encounter for pregnancy test, result unknown: Secondary | ICD-10-CM

## 2020-06-20 DIAGNOSIS — Z3201 Encounter for pregnancy test, result positive: Secondary | ICD-10-CM

## 2020-06-20 LAB — POCT URINE PREGNANCY: Preg Test, Ur: POSITIVE — AB

## 2020-06-20 MED ORDER — VITAFOL FE+ 90-0.6-0.4-200 MG PO CAPS: 1.0000 | ORAL_CAPSULE | Freq: Every day | ORAL | 12 refills | Status: DC

## 2020-06-20 NOTE — Progress Notes (Signed)
Patient was assessed and managed by nursing staff during this encounter. I have reviewed the chart and agree with the documentation and plan. I have also made any necessary editorial changes.  Mora Bellman, MD 06/20/2020 3:48 PM

## 2020-06-20 NOTE — Progress Notes (Signed)
..   Kayla Liu presents today for UPT. She has no unusual complaints. LMP:05-09-20    OBJECTIVE: Appears well, in no apparent distress.  OB History    Gravida  2   Para  1   Term  1   Preterm      AB      Living  1     SAB      IAB      Ectopic      Multiple  0   Live Births  1          Home UPT Result:Positive In-Office UPT result: Positive I have reviewed the patient's medical, obstetrical, social, and family histories, and medications.   ASSESSMENT: Positive pregnancy test  PLAN Prenatal care to be completed at: [facility]

## 2020-06-26 ENCOUNTER — Ambulatory Visit: Payer: Medicaid Other | Admitting: Obstetrics

## 2020-07-17 ENCOUNTER — Ambulatory Visit (INDEPENDENT_AMBULATORY_CARE_PROVIDER_SITE_OTHER): Payer: Medicaid Other

## 2020-07-17 ENCOUNTER — Other Ambulatory Visit: Payer: Self-pay

## 2020-07-17 VITALS — BP 109/71 | HR 76 | Ht <= 58 in | Wt 135.2 lb

## 2020-07-17 DIAGNOSIS — Z3A01 Less than 8 weeks gestation of pregnancy: Secondary | ICD-10-CM | POA: Diagnosis not present

## 2020-07-17 DIAGNOSIS — Z3491 Encounter for supervision of normal pregnancy, unspecified, first trimester: Secondary | ICD-10-CM | POA: Insufficient documentation

## 2020-07-17 DIAGNOSIS — Z3481 Encounter for supervision of other normal pregnancy, first trimester: Secondary | ICD-10-CM

## 2020-07-17 DIAGNOSIS — O3680X0 Pregnancy with inconclusive fetal viability, not applicable or unspecified: Secondary | ICD-10-CM

## 2020-07-17 MED ORDER — BLOOD PRESSURE KIT DEVI: 1.0000 | 0 refills | Status: DC

## 2020-07-17 NOTE — Progress Notes (Signed)
New OB Intake  I connected with  [name] on 07/17/20 at 10:15 AM EDT by in office and verified that I am speaking with the correct person using two identifiers. Nurse is located at [facility] and pt is located at in office.  I discussed the limitations, risks, security and privacy concerns of performing an evaluation and management service by telephone and the availability of in person appointments. I also discussed with the patient that there may be a patient responsible charge related to this service. The patient expressed understanding and agreed to proceed.  I explained I am completing New OB Intake today. We discussed her EDD is not determined at this time. Pt is G2/P1. I reviewed her allergies, medications, Medical/Surgical/OB history, and appropriate screenings. I informed her of [facility] services. Based on history, this is a/an uncomplicated pregnancy.  Patient Active Problem List   Diagnosis Date Noted  . Encounter for supervision of normal pregnancy in first trimester 07/17/2020  . Postpartum anemia 04/24/2018  . No blood products 04/23/2018  . Status post cesarean delivery 04/23/2018  . HSV (herpes simplex virus) anogenital infection 04/09/2018  . Gestational diabetes mellitus (GDM), antepartum 01/12/2018  . Supervision of normal pregnancy, antepartum 10/19/2017    Concerns addressed today  Delivery Plans:  Plans to deliver at [facility].   MyChart/Babyscripts MyChart access verified. I explained pt will have some visits in office and some virtually. Babyscripts instructions given and order placed. Patient verifies receipt of registration text/e-mail. Account successfully created and app downloaded.  Blood Pressure Cuff Blood pressure cuff ordered for patient to pick-up from [employer]. Explained after first prenatal appt pt will check weekly and document in 91.  Anatomy US Explained first scheduled [place] will be around 19 weeks. Dating and Viability scan  performed today  Labs Discussed Johnsie Cancel genetic screening with patient. Would like both Panorama and Horizon drawn at new OB visit. Routine prenatal labs needed.  Covid Vaccine Patient has covid vaccine.   Social Determinants of Health . Food Insecurity: Patient denies food insecurity. . WIC Referral: Patient is interested in referral to [facility].  . Transportation: Patient denies transportation needs. . Childcare: Discussed no children allowed at ultrasound appointments. Offered childcare services; patient declines childcare services at this time.  First visit review I reviewed new OB appt with pt. I explained she will have a pelvic exam, ob bloodwork with genetic screening, and PAP smear. Explained pt will be seen by Rayna Sexton at first visit; encounter routed to appropriate provider. Explained that patient will be seen by pregnancy navigator following visit with provider.  Lucianne Lei, RN 07/17/2020  10:48 AM

## 2020-07-17 NOTE — Progress Notes (Signed)
Agree with A & P. 

## 2020-07-24 ENCOUNTER — Encounter: Payer: Medicaid Other | Admitting: Family Medicine

## 2020-07-29 ENCOUNTER — Ambulatory Visit
Admission: RE | Admit: 2020-07-29 | Discharge: 2020-07-29 | Disposition: A | Discharge status: Home / Self Care | Payer: Medicaid Other | Source: Ambulatory Visit | Attending: Obstetrics and Gynecology | Admitting: Obstetrics and Gynecology

## 2020-07-29 ENCOUNTER — Encounter (HOSPITAL_BASED_OUTPATIENT_CLINIC_OR_DEPARTMENT_OTHER): Payer: Self-pay | Admitting: Obstetrics and Gynecology

## 2020-07-29 ENCOUNTER — Other Ambulatory Visit: Payer: Self-pay

## 2020-07-29 ENCOUNTER — Ambulatory Visit (INDEPENDENT_AMBULATORY_CARE_PROVIDER_SITE_OTHER): Payer: Medicaid Other | Admitting: Obstetrics and Gynecology

## 2020-07-29 ENCOUNTER — Encounter: Payer: Self-pay | Admitting: Obstetrics and Gynecology

## 2020-07-29 VITALS — BP 107/65 | HR 71 | Ht <= 58 in | Wt 135.0 lb

## 2020-07-29 DIAGNOSIS — O021 Missed abortion: Secondary | ICD-10-CM

## 2020-07-29 DIAGNOSIS — O3680X0 Pregnancy with inconclusive fetal viability, not applicable or unspecified: Secondary | ICD-10-CM | POA: Diagnosis not present

## 2020-07-29 NOTE — Progress Notes (Signed)
Obstetrics and Gynecology Visit Work In Visit  Appointment Date: 07/29/2020  Primary Care Provider: Pediatrics, [place] [facility]   Chief Complaint: discuss ultrasound findings  History of Present Illness:  Kayla Liu is a 25 y.o. G2P1011 with above CC.   Patient had f/u u/s today and fetal pole looks the same size vs on 5/26 and no cardiac motion (see below) so patient added on for visit today to d/w her re: findings.   Patient only endorses cramping and no VB, fevers, chills.   Review of Systems: as noted in the History of Present Illness.  Patient Active Problem List   Diagnosis Date Noted  . Encounter for supervision of normal pregnancy in first trimester 07/17/2020  . Postpartum anemia 04/24/2018  . No blood products 04/23/2018  . Status post cesarean delivery 04/23/2018  . HSV (herpes simplex virus) anogenital infection 04/09/2018  . Gestational diabetes mellitus (GDM), antepartum 01/12/2018  . Supervision of normal pregnancy, antepartum 10/19/2017   Medications:  Kayla Liu had no medications administered during this visit. Current Outpatient Medications  Medication Sig Dispense Refill  . Blood Pressure Monitoring (BLOOD PRESSURE KIT) DEVI 1 kit by Does not apply route once a week. 1 each 0  . Prenat-Fe Poly-Methfol-FA-DHA (VITAFOL FE+) 90-0.6-0.4-200 MG CAPS Take 1 tablet by mouth daily. 30 capsule 12  . Prenatal Vit-Fe Fumarate-FA (PRENATAL MULTIVITAMIN) TABS tablet Take 1 tablet by mouth daily at 12 noon. (Patient not taking: Reported on 07/17/2020)    . valACYclovir (VALTREX) 500 MG tablet Take 500 mg by mouth daily.     No current facility-administered medications for this visit.    Allergies: has No Known Allergies.  Physical Exam:  BP 107/65   Pulse 71   Ht '4\' 10"'  (1.473 m)   Wt 135 lb (61.2 kg)   LMP 05/09/2020   BMI 28.22 kg/m  Body mass index is 28.22 kg/m. General appearance: Well  nourished, well developed female in no acute distress.  Neuro/Psych:  Normal mood and affect.    Labs:  A pos  Radiology: Narrative & Impression  CLINICAL DATA:  First trimester pregnancy, assessment of dating and viability, LMP 05/09/2020  EXAM: OBSTETRIC <14 WK [place] AND TRANSVAGINAL OB US  TECHNIQUE: Both transabdominal and transvaginal ultrasound examinations were performed for complete evaluation of the gestation as well as the maternal uterus, adnexal regions, and pelvic cul-de-sac. Transvaginal technique was performed to assess early pregnancy.  COMPARISON:  07/14/2020  FINDINGS: Intrauterine gestational sac: Present, single  Yolk sac:  Not identified  Embryo:  Present  Cardiac Activity: Not identified  Heart Rate: N/A  bpm  MSD: 27.8 mm   7 w   6 d  CRL:  3.9 mm   6 w   1 d                  [place] EDC: 03/23/2021  Subchorionic hemorrhage:  None visualized.  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  RIGHT ovary normal size and morphology 3.0 x 1.0 x 1.3 cm.  LEFT ovary measures 3.3 x 1.7 x 1.4 cm and contains a small probable corpus luteum.  Trace free pelvic fluid.  No adnexal masses.  IMPRESSION: Gestational sacs identified within the uterus containing a fetal pole.  No fetal cardiac activity is identified.  In addition, fetal pole has not increased in size since the previous exam.  Findings are suspicious  but not yet definitive for failed pregnancy. Recommend follow-up US in 10-14 days for definitive diagnosis. This recommendation follows SRU consensus guidelines: Diagnostic Criteria for Nonviable Pregnancy Early in the First Trimester. [place] [employer] Med 2013; 283:6629-47.   Electronically Signed   By: Lavonia Dana M.D.   On: 07/29/2020 11:05   Narrative & Impression  ----------------------------------------------------------------------   OBSTETRICS REPORT                       (Signed Final 07/18/2020 01:57 pm) ---------------------------------------------------------------------- Patient Info  ID #:       [id number]                          D.O.B.:  1996/01/19 (24 yrs)  Name:       Kayla Liu                Visit Date: 07/17/2020 01:14 pm ---------------------------------------------------------------------- Performed By  Attending:        Arlina Robes MD       Ref. Address:     [place], [place]  Performed By:     Elyn Peers RN         Location:         Center for                                                             [facility]  Referred By:      Legrand Como  Ocie Cornfield                    MD ---------------------------------------------------------------------- Orders  #  Description                           Code        Ordered By  1  US OB LIMITED                         23557.3     Arlina Robes ----------------------------------------------------------------------  #  Order #                     Accession #                Episode #  1  [id number]                   [id number]                 [id number] ---------------------------------------------------------------------- Indications  Less than [redacted] weeks gestation of pregnancy       Z3A.01  Pregnancy with inconclusive fetal viability    O36.80X0 ---------------------------------------------------------------------- Fetal Evaluation  Num Of Fetuses:         1  Preg. Location:         Intrauterine  Cardiac Activity:       Not visualized ---------------------------------------------------------------------- Biometry  GS:        20.1  mm     G. Age:  7w 1d                   EDD:   03/04/21  CRL:       3.9  mm     G. Age:  6w 1d                   EDD:   03/11/21 ---------------------------------------------------------------------- Gestational Age  Best:          6w 1d      Det. By:  U/S C R L (07/17/20)     EDD:   03/11/21 ---------------------------------------------------------------------- Comments  IUP at about 27w1dby CG[place] Dates are not consistent with  LMP provided 05/07/20.  Technically limited exam due to early  Gestational Age. Per MD repeat scan in 10-14 days ---------------------------------------------------------------------- Impression  Single IUP at 657w1dy CRMaddockCardiac activity not noted ---------------------------------------------------------------------- Recommendations  Repeat U/S in 10-14 days to confrim viability ----------------------------------------------------------------------                  MiArlina RobesMD Electronically Signed Final Report   07/18/2020 01:57 pm    Assessment: pt stable  Plan: Images reviewed and GS is oblong and inside sac, fetal pole looks well defined.   I told her she has a missed AB and condolences given. Options d/w her and she elects for suction d&c. ED precautions given and request sent to OR scheduling for this  RTC: post op  ChDurene RomansD Attending [facility]

## 2020-07-30 ENCOUNTER — Other Ambulatory Visit (HOSPITAL_COMMUNITY)
Admission: RE | Admit: 2020-07-30 | Discharge: 2020-07-30 | Disposition: A | Discharge status: Home / Self Care | Payer: Medicaid Other | Source: Ambulatory Visit | Attending: Obstetrics and Gynecology | Admitting: Obstetrics and Gynecology

## 2020-07-30 ENCOUNTER — Encounter (HOSPITAL_COMMUNITY): Payer: Self-pay | Admitting: Obstetrics and Gynecology

## 2020-07-30 ENCOUNTER — Telehealth: Payer: Self-pay | Admitting: *Deleted

## 2020-07-30 DIAGNOSIS — Z01812 Encounter for preprocedural laboratory examination: Secondary | ICD-10-CM | POA: Insufficient documentation

## 2020-07-30 DIAGNOSIS — Z20822 Contact with and (suspected) exposure to covid-19: Secondary | ICD-10-CM | POA: Insufficient documentation

## 2020-07-30 LAB — SARS CORONAVIRUS 2 (TAT 6-24 HRS): SARS Coronavirus 2: NEGATIVE

## 2020-07-30 NOTE — Progress Notes (Addendum)
Mrs Rieger denies chest pain or shortness of breath. Mrs Maclellan denies any s/s of Covid in her or her household, and has not been exposed to anyone to her knowledge.  Kayla Liu was tested for Covid and reports that she is wearing a mask when around other people.

## 2020-07-30 NOTE — Telephone Encounter (Signed)
Call to patient to advise surgery scheduled for 07-31-20 has been moved to [facility]. Unable to leave message, no voice mail set up.

## 2020-07-30 NOTE — H&P (Signed)
Kayla Liu is an 25 y.o. G59P1011 female with a MAB at [redacted]w[redacted]d, confirmed by U/S. Pt desires surgerical management.  Blood type A +   Menstrual History: Menarche age: 55 Patient's last menstrual period was 05/09/2020.    Past Medical History:  Diagnosis Date  . HSV (herpes simplex virus) infection 2019    Past Surgical History:  Procedure Laterality Date  . CESAREAN SECTION N/A 04/23/2018   Procedure: CESAREAN SECTION;  Surgeon: Aletha Halim, MD;  Location: [facility] LD ORS;  Service: Obstetrics;  Laterality: N/A;    Family History  Problem Relation Age of Onset  . Hypertension Maternal Aunt   . Healthy Mother   . Healthy Father     Social History:  reports that she has quit smoking. Her smoking use included cigarettes. She has never used smokeless tobacco. She reports previous drug use. She reports that she does not drink alcohol.  Allergies: No Known Allergies  No medications prior to admission.    Review of Systems  Constitutional: Negative.   Respiratory: Negative.   Cardiovascular: Negative.   Gastrointestinal: Negative.     Height 4\' 10"  (1.473 m), weight 61.2 kg, last menstrual period 05/09/2020, currently breastfeeding. Physical Exam Constitutional:      Appearance: Normal appearance.  Cardiovascular:     Rate and Rhythm: Normal rate.     Heart sounds: Normal heart sounds.  Pulmonary:     Breath sounds: Normal breath sounds.  Abdominal:     General: Bowel sounds are normal.     Palpations: Abdomen is soft.  Genitourinary:    Comments: Deferred to OR Neurological:     Mental Status: She is alert.     No results found for this or any previous visit (from the past 24 hour(s)).  US OB LESS THAN 14 WEEKS WITH OB TRANSVAGINAL  Result Date: 07/29/2020 CLINICAL DATA:  First trimester pregnancy, assessment of dating and viability, LMP 05/09/2020 EXAM: OBSTETRIC <14 WK [place] AND TRANSVAGINAL OB US TECHNIQUE: Both transabdominal and transvaginal ultrasound  examinations were performed for complete evaluation of the gestation as well as the maternal uterus, adnexal regions, and pelvic cul-de-sac. Transvaginal technique was performed to assess early pregnancy. COMPARISON:  07/14/2020 FINDINGS: Intrauterine gestational sac: Present, single Yolk sac:  Not identified Embryo:  Present Cardiac Activity: Not identified Heart Rate: N/A  bpm MSD: 27.8 mm   7 w   6 d CRL:  3.9 mm   6 w   1 d                  [place] EDC: 03/23/2021 Subchorionic hemorrhage:  None visualized. 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 RIGHT ovary normal size and morphology 3.0 x 1.0 x 1.3 cm. LEFT ovary measures 3.3 x 1.7 x 1.4 cm and contains a small probable corpus luteum. Trace free pelvic fluid. No adnexal masses. IMPRESSION: Gestational sacs identified within the uterus containing a fetal pole. No fetal cardiac activity is identified. In addition, fetal pole has not increased in size since the previous exam. Findings are suspicious but not yet definitive for failed pregnancy. Recommend follow-up US in 10-14 days for definitive diagnosis. This recommendation follows SRU consensus guidelines: Diagnostic Criteria for Nonviable Pregnancy Early in the First Trimester. [place] Med 2013; 563:1497-02. Electronically Signed   By: Lavonia Dana M.D.   On: 07/29/2020 11:05    Assessment/Plan: MAB  Suction D & C has been reviewed with pt.  R/B/Post care discussed. Pt has verbalized understanding and desires to proceed.   Chancy Milroy 07/30/2020, 9:07 AM

## 2020-07-30 NOTE — Telephone Encounter (Signed)
Patient called back. Advised of change in location. Voiced understanding. Encounter closed.

## 2020-07-31 ENCOUNTER — Encounter (HOSPITAL_COMMUNITY)
Admission: RE | Disposition: A | Discharge status: Home / Self Care | Payer: Self-pay | Source: Home / Self Care | Attending: Obstetrics and Gynecology

## 2020-07-31 ENCOUNTER — Encounter (HOSPITAL_COMMUNITY): Payer: Self-pay | Admitting: Obstetrics and Gynecology

## 2020-07-31 ENCOUNTER — Ambulatory Visit (HOSPITAL_COMMUNITY): Payer: Medicaid Other | Admitting: Anesthesiology

## 2020-07-31 ENCOUNTER — Ambulatory Visit (HOSPITAL_COMMUNITY)
Admission: RE | Admit: 2020-07-31 | Discharge: 2020-07-31 | Disposition: A | Discharge status: Home / Self Care | Payer: Medicaid Other | Attending: Obstetrics and Gynecology | Admitting: Obstetrics and Gynecology

## 2020-07-31 DIAGNOSIS — D252 Subserosal leiomyoma of uterus: Secondary | ICD-10-CM | POA: Diagnosis not present

## 2020-07-31 DIAGNOSIS — Z3A01 Less than 8 weeks gestation of pregnancy: Secondary | ICD-10-CM | POA: Insufficient documentation

## 2020-07-31 DIAGNOSIS — O021 Missed abortion: Secondary | ICD-10-CM

## 2020-07-31 DIAGNOSIS — Z87891 Personal history of nicotine dependence: Secondary | ICD-10-CM | POA: Insufficient documentation

## 2020-07-31 HISTORY — PX: DILATION AND EVACUATION: SHX1459

## 2020-07-31 LAB — CBC
HCT: 36.2 % (ref 36.0–46.0)
Hemoglobin: 12.4 g/dL (ref 12.0–15.0)
MCH: 31.5 pg (ref 26.0–34.0)
MCHC: 34.3 g/dL (ref 30.0–36.0)
MCV: 91.9 fL (ref 80.0–100.0)
Platelets: 339 10*3/uL (ref 150–400)
RBC: 3.94 MIL/uL (ref 3.87–5.11)
RDW: 12.6 % (ref 11.5–15.5)
WBC: 4.8 10*3/uL (ref 4.0–10.5)
nRBC: 0 % (ref 0.0–0.2)

## 2020-07-31 SURGERY — DILATION AND EVACUATION, UTERUS
Anesthesia: General | Site: Vagina

## 2020-07-31 MED ORDER — CHLORHEXIDINE GLUCONATE 0.12 % MT SOLN
OROMUCOSAL | Status: AC
  Administered 2020-07-31: 15 mL via OROMUCOSAL
  Filled 2020-07-31: qty 15

## 2020-07-31 MED ORDER — EPHEDRINE 5 MG/ML INJ
INTRAVENOUS | Status: AC
  Filled 2020-07-31: qty 10

## 2020-07-31 MED ORDER — MISOPROSTOL 200 MCG PO TABS
ORAL_TABLET | ORAL | Status: DC | PRN
  Administered 2020-07-31: 200 ug via VAGINAL

## 2020-07-31 MED ORDER — ROCURONIUM BROMIDE 10 MG/ML (PF) SYRINGE
PREFILLED_SYRINGE | INTRAVENOUS | Status: AC
  Filled 2020-07-31: qty 20

## 2020-07-31 MED ORDER — MIDAZOLAM HCL 5 MG/5ML IJ SOLN
INTRAMUSCULAR | Status: DC | PRN
  Administered 2020-07-31: 2 mg via INTRAVENOUS

## 2020-07-31 MED ORDER — SOD CITRATE-CITRIC ACID 500-334 MG/5ML PO SOLN: 30.0000 mL | ORAL | Status: AC

## 2020-07-31 MED ORDER — FENTANYL CITRATE (PF) 250 MCG/5ML IJ SOLN
INTRAMUSCULAR | Status: DC | PRN
  Administered 2020-07-31 (×2): 25 ug via INTRAVENOUS

## 2020-07-31 MED ORDER — ACETAMINOPHEN 500 MG PO TABS: 1000.0000 mg | ORAL_TABLET | ORAL | Status: AC

## 2020-07-31 MED ORDER — IBUPROFEN 800 MG PO TABS: 800.0000 mg | ORAL_TABLET | Freq: Three times a day (TID) | ORAL | 0 refills | Status: DC | PRN

## 2020-07-31 MED ORDER — OXYCODONE HCL 5 MG PO TABS: 5.0000 mg | ORAL_TABLET | Freq: Once | ORAL | Status: DC | PRN

## 2020-07-31 MED ORDER — DEXAMETHASONE SODIUM PHOSPHATE 10 MG/ML IJ SOLN
INTRAMUSCULAR | Status: DC | PRN
  Administered 2020-07-31: 5 mg via INTRAVENOUS

## 2020-07-31 MED ORDER — FENTANYL CITRATE (PF) 250 MCG/5ML IJ SOLN
INTRAMUSCULAR | Status: AC
  Filled 2020-07-31: qty 5

## 2020-07-31 MED ORDER — OXYCODONE HCL 5 MG PO TABS: 5.0000 mg | ORAL_TABLET | Freq: Four times a day (QID) | ORAL | 0 refills | Status: DC | PRN

## 2020-07-31 MED ORDER — FENTANYL CITRATE (PF) 100 MCG/2ML IJ SOLN: 25.0000 ug | INTRAMUSCULAR | Status: DC | PRN

## 2020-07-31 MED ORDER — KETOROLAC TROMETHAMINE 15 MG/ML IJ SOLN
15.0000 mg | INTRAMUSCULAR | Status: AC
Start: 2020-07-31 — End: 2020-07-31
  Administered 2020-07-31: 15 mg via INTRAVENOUS

## 2020-07-31 MED ORDER — PROPOFOL 10 MG/ML IV BOLUS
INTRAVENOUS | Status: AC
  Filled 2020-07-31: qty 20

## 2020-07-31 MED ORDER — CHLORHEXIDINE GLUCONATE 0.12 % MT SOLN: 15.0000 mL | Freq: Once | OROMUCOSAL | Status: AC

## 2020-07-31 MED ORDER — PHENYLEPHRINE 40 MCG/ML (10ML) SYRINGE FOR IV PUSH (FOR BLOOD PRESSURE SUPPORT)
PREFILLED_SYRINGE | INTRAVENOUS | Status: DC | PRN
  Administered 2020-07-31: 80 ug via INTRAVENOUS

## 2020-07-31 MED ORDER — DEXAMETHASONE SODIUM PHOSPHATE 10 MG/ML IJ SOLN
INTRAMUSCULAR | Status: AC
  Filled 2020-07-31: qty 3

## 2020-07-31 MED ORDER — PHENYLEPHRINE 40 MCG/ML (10ML) SYRINGE FOR IV PUSH (FOR BLOOD PRESSURE SUPPORT)
PREFILLED_SYRINGE | INTRAVENOUS | Status: AC
  Filled 2020-07-31: qty 20

## 2020-07-31 MED ORDER — PROPOFOL 10 MG/ML IV BOLUS
INTRAVENOUS | Status: DC | PRN
  Administered 2020-07-31: 200 mg via INTRAVENOUS

## 2020-07-31 MED ORDER — KETOROLAC TROMETHAMINE 15 MG/ML IJ SOLN
INTRAMUSCULAR | Status: AC
  Filled 2020-07-31: qty 1

## 2020-07-31 MED ORDER — DOXYCYCLINE HYCLATE 100 MG IV SOLR
200.0000 mg | INTRAVENOUS | Status: AC
  Administered 2020-07-31: 200 mg via INTRAVENOUS
  Filled 2020-07-31: qty 200

## 2020-07-31 MED ORDER — ACETAMINOPHEN 500 MG PO TABS
ORAL_TABLET | ORAL | Status: AC
  Administered 2020-07-31: 1000 mg via ORAL
  Filled 2020-07-31: qty 2

## 2020-07-31 MED ORDER — LIDOCAINE 2% (20 MG/ML) 5 ML SYRINGE
INTRAMUSCULAR | Status: DC | PRN
  Administered 2020-07-31: 60 mg via INTRAVENOUS

## 2020-07-31 MED ORDER — FERRIC SUBSULFATE 259 MG/GM EX SOLN
CUTANEOUS | Status: AC
  Filled 2020-07-31: qty 8

## 2020-07-31 MED ORDER — SOD CITRATE-CITRIC ACID 500-334 MG/5ML PO SOLN
ORAL | Status: AC
  Administered 2020-07-31: 30 mL via ORAL
  Filled 2020-07-31: qty 15

## 2020-07-31 MED ORDER — OXYCODONE HCL 5 MG/5ML PO SOLN: 5.0000 mg | Freq: Once | ORAL | Status: DC | PRN

## 2020-07-31 MED ORDER — MIDAZOLAM HCL 2 MG/2ML IJ SOLN
INTRAMUSCULAR | Status: AC
  Filled 2020-07-31: qty 2

## 2020-07-31 MED ORDER — ORAL CARE MOUTH RINSE: 15.0000 mL | Freq: Once | OROMUCOSAL | Status: AC

## 2020-07-31 MED ORDER — POVIDONE-IODINE 10 % EX SWAB
2.0000 "application " | Freq: Once | CUTANEOUS | Status: AC
  Administered 2020-07-31: 2 via TOPICAL

## 2020-07-31 MED ORDER — LIDOCAINE HCL (PF) 2 % IJ SOLN
INTRAMUSCULAR | Status: AC
  Filled 2020-07-31: qty 10

## 2020-07-31 MED ORDER — LACTATED RINGERS IV SOLN: INTRAVENOUS | Status: DC

## 2020-07-31 MED ORDER — ONDANSETRON HCL 4 MG/2ML IJ SOLN
INTRAMUSCULAR | Status: AC
  Filled 2020-07-31: qty 6

## 2020-07-31 MED ORDER — DIPHENHYDRAMINE HCL 50 MG/ML IJ SOLN
INTRAMUSCULAR | Status: AC
  Filled 2020-07-31: qty 2

## 2020-07-31 MED ORDER — PROMETHAZINE HCL 25 MG/ML IJ SOLN: 6.2500 mg | INTRAMUSCULAR | Status: DC | PRN

## 2020-07-31 MED ORDER — DIPHENHYDRAMINE HCL 50 MG/ML IJ SOLN
INTRAMUSCULAR | Status: AC
  Filled 2020-07-31: qty 1

## 2020-07-31 MED ORDER — ONDANSETRON HCL 4 MG/2ML IJ SOLN
INTRAMUSCULAR | Status: DC | PRN
  Administered 2020-07-31: 4 mg via INTRAVENOUS

## 2020-07-31 MED ORDER — MISOPROSTOL 200 MCG PO TABS
ORAL_TABLET | ORAL | Status: AC
  Filled 2020-07-31: qty 5

## 2020-07-31 SURGICAL SUPPLY — 21 items
CATH ROBINSON RED A/P 16FR (CATHETERS) ×3 IMPLANT
DECANTER SPIKE VIAL GLASS SM (MISCELLANEOUS) ×3 IMPLANT
GLOVE SURG ENC MOIS LTX SZ7.5 (GLOVE) ×3 IMPLANT
GLOVE SURG UNDER POLY LF SZ7 (GLOVE) ×3 IMPLANT
GOWN STRL REUS W/ TWL LRG LVL3 (GOWN DISPOSABLE) ×1 IMPLANT
GOWN STRL REUS W/ TWL XL LVL3 (GOWN DISPOSABLE) ×1 IMPLANT
GOWN STRL REUS W/TWL LRG LVL3 (GOWN DISPOSABLE) ×2
GOWN STRL REUS W/TWL XL LVL3 (GOWN DISPOSABLE) ×2
HIBICLENS CHG 4% 4OZ BTL (MISCELLANEOUS) ×3 IMPLANT
KIT BERKELEY 1ST TRI 3/8 NO TR (MISCELLANEOUS) ×3 IMPLANT
KIT BERKELEY 1ST TRIMESTER 3/8 (MISCELLANEOUS) ×3 IMPLANT
NS IRRIG 1000ML POUR BTL (IV SOLUTION) ×3 IMPLANT
PACK VAGINAL MINOR WOMEN LF (CUSTOM PROCEDURE TRAY) ×3 IMPLANT
PAD OB MATERNITY 4.3X12.25 (PERSONAL CARE ITEMS) ×3 IMPLANT
PAD PREP 24X48 CUFFED NSTRL (MISCELLANEOUS) ×3 IMPLANT
SET BERKELEY SUCTION TUBING (SUCTIONS) ×3 IMPLANT
TOWEL GREEN STERILE FF (TOWEL DISPOSABLE) ×6 IMPLANT
VACURETTE 10 RIGID CVD (CANNULA) IMPLANT
VACURETTE 7MM CVD STRL WRAP (CANNULA) IMPLANT
VACURETTE 8 RIGID CVD (CANNULA) IMPLANT
VACURETTE 9 RIGID CVD (CANNULA) IMPLANT

## 2020-07-31 NOTE — Anesthesia Procedure Notes (Signed)
Procedure Name: LMA Insertion Date/Time: 07/31/2020 3:52 PM Performed by: Imagene Riches, CRNA Pre-anesthesia Checklist: Patient identified, Emergency Drugs available, Suction available and Patient being monitored Patient Re-evaluated:Patient Re-evaluated prior to induction Oxygen Delivery Method: Circle System Utilized Preoxygenation: Pre-oxygenation with 100% oxygen Induction Type: IV induction Ventilation: Mask ventilation without difficulty LMA: LMA inserted LMA Size: 4.0 Number of attempts: 1 Airway Equipment and Method: Bite block Placement Confirmation: positive ETCO2 Tube secured with: Tape Dental Injury: Teeth and Oropharynx as per pre-operative assessment

## 2020-07-31 NOTE — Transfer of Care (Signed)
Immediate Anesthesia Transfer of Care Note  Patient: Kayla Liu  Procedure(s) Performed: DILATATION AND EVACUATION (N/A Vagina )  Patient Location: PACU  Anesthesia Type:General  Level of Consciousness: drowsy  Airway & Oxygen Therapy: Patient Spontanous Breathing and Patient connected to nasal cannula oxygen  Post-op Assessment: Report given to RN and Post -op Vital signs reviewed and stable  Post vital signs: Reviewed and stable  Last Vitals:  Vitals Value Taken Time  BP 81/45 07/31/20 1634  Temp    Pulse 52 07/31/20 1635  Resp 29 07/31/20 1635  SpO2 99 % 07/31/20 1635  Vitals shown include unvalidated device data.  Last Pain:  Vitals:   07/31/20 1356  TempSrc:   PainSc: 0-No pain         Complications: No complications documented.

## 2020-07-31 NOTE — Discharge Instructions (Signed)
Dilation and Curettage or Vacuum Curettage, Care After This sheet gives you information about how to care for yourself after your procedure. Your doctor may also give you more specific instructions. If you have problems or questions, contact your doctor. What can I expect after the procedure? After the procedure, it is common to have:  Mild pain or cramping.  Some bleeding or spotting from the vagina. These may last for up to 2 weeks. Follow these instructions at home: Medicines  Take over-the-counter and prescription medicines only as told by your doctor. This is very important if you take blood-thinning medicine.  Ask your doctor if the medicine prescribed to you requires you to avoid driving or using machinery. Activity  If you were given a medicine to help you relax (sedative) during your procedure, it can affect you for many hours. Do not drive or use machinery until your doctor says that it is safe.  Rest as told by your doctor.  Do not sit for a long time without moving. Get up to take short walks every 1-2 hours. This is important. Ask for help if you feel weak or unsteady.  Do not lift anything that is heavier than 10 lb (4.5 kg), or the limit that you are told, until your doctor says that it is safe.  Return to your normal activities as told by your doctor. Ask your doctor what activities are safe for you.   Lifestyle For at least 2 weeks, or as long as told by your doctor:  Do not douche.  Do not use tampons.  Do not have sex. General instructions  Wear compression stockings as told by your doctor.  It is up to you to get the results of your procedure. Ask your doctor, or the department that is doing the procedure, when your results will be ready.  Keep all follow-up visits as told by your doctor. This is important. Contact a doctor if:  You have very bad cramps that get worse or do not get better with medicine.  You have very bad pain in your belly  (abdomen).  You cannot drink fluids without vomiting.  You have pain in a different part of your pelvis. The pelvis is the area just above your thighs.  You have fluid from your vagina that smells bad.  You have a rash. Get help right away if:  You are bleeding a lot from your vagina. A lot of bleeding means soaking more than one sanitary pad in 1 hour for 2 hours in a row.  You have a fever that is above 100.31F (38.0C).  Your belly feels very tender or hard.  You have chest pain.  You have trouble breathing.  You feel dizzy.  You feel light-headed.  You pass out (faint).  You have pain in your neck or shoulder area. These symptoms may be an emergency. Do not wait to see if the symptoms will go away. Get medical help right away. Call your local emergency services (911 in the [place] Do not drive yourself to the hospital. Summary  After your procedure, it is common to have pain or cramping. It is also common to have bleeding or spotting from your vagina.  Rest as told. Do not sit for a long time without moving. Get up to take short walks every 1-2 hours.  Do not lift anything that is heavier than 10 lb (4.5 kg), or the limit that you are told.  Contact your doctor if you have fluid from  your vagina that smells bad.  Get help right away if you develop any problems from the procedure. Ask your doctor what problems to watch for. This information is not intended to replace advice given to you by your health care provider. Make sure you discuss any questions you have with your health care provider. Document Revised: 03/14/2019 Document Reviewed: 03/14/2019 Elsevier Patient Education  2021 [employer].

## 2020-07-31 NOTE — Op Note (Signed)
Kayla Liu PROCEDURE DATE: 07/31/2020  PREOPERATIVE DIAGNOSIS: 6 week missed abortion POSTOPERATIVE DIAGNOSIS: The same PROCEDURE:     Dilation and Evacuation SURGEON:  Dr. Arlina Robes  INDICATIONS: 25 y.o. G2P1001 with MAB at [redacted] weeks gestation, needing surgical completion.  Risks of surgery were discussed with the patient including but not limited to: bleeding which may require transfusion; infection which may require antibiotics; injury to uterus or surrounding organs; need for additional procedures including laparotomy or laparoscopy; possibility of intrauterine scarring which may impair future fertility; and other postoperative/anesthesia complications. Written informed consent was obtained.    FINDINGS:  A 8 week size uterus, moderate amounts of products of conception, specimen sent to pathology.  ANESTHESIA:    Monitored intravenous sedation, paracervical block. INTRAVENOUS FLUIDS:  As recorded  ESTIMATED BLOOD LOSS:  100 cc SPECIMENS:  Products of conception sent to pathology COMPLICATIONS:  None immediate.  PROCEDURE DETAILS:  The patient received intravenous Doxycycline while in the preoperative area.  She was then taken to the operating room where monitored intravenous sedation was administered and was found to be adequate.  After an adequate timeout was performed, she was placed in the dorsal lithotomy position and examined; then prepped and draped in the sterile manner.   Her bladder was catheterized for an unmeasured amount of clear, yellow urine. A vaginal speculum was then placed in the patient's vagina and a single tooth tenaculum was applied to the anterior lip of the cervix.  The cervix was gently dilated to accommodate a 8  mm suction curette that was gently advanced to the uterine fundus.  The suction device was then activated and curette slowly rotated to clear the uterus of products of conception.  A sharp curettage was then performed to confirm complete emptying of the  uterus. There was minimal bleeding noted and the tenaculum removed with good hemostasis noted.   All instruments were removed from the patient's vagina.  Sponge and instrument counts were correct times two. 200 mcg of Cytotec was placed rectal at conclusion of the case   The patient tolerated the procedure well and was taken to the recovery area awake, and in stable condition.  The patient will be discharged to home as per PACU criteria.  Routine postoperative instructions given.   Arlina Robes, MD, [place] Attending [place], [facility]

## 2020-07-31 NOTE — Interval H&P Note (Signed)
History and Physical Interval Note:  07/31/2020 2:10 PM  Kayla Liu  has presented today for surgery, with the diagnosis of Missed AB.  The various methods of treatment have been discussed with the patient and family. After consideration of risks, benefits and other options for treatment, the patient has consented to  Procedure(s): DILATATION AND EVACUATION (N/A) as a surgical intervention.  The patient's history has been reviewed, patient examined, no change in status, stable for surgery.  I have reviewed the patient's chart and labs.  Questions were answered to the patient's satisfaction.     Chancy Milroy

## 2020-07-31 NOTE — Anesthesia Preprocedure Evaluation (Addendum)
Anesthesia Evaluation  Patient identified by MRN, date of birth, ID band Patient awake    Reviewed: Allergy & Precautions, NPO status , Patient's Chart, lab work & pertinent test results  Airway Mallampati: II  TM Distance: >3 FB Neck ROM: Full    Dental no notable dental hx. (+) Teeth Intact, Dental Advisory Given   Pulmonary former smoker,    Pulmonary exam normal breath sounds clear to auscultation       Cardiovascular negative cardio ROS Normal cardiovascular exam Rhythm:Regular Rate:Normal     Neuro/Psych negative neurological ROS  negative psych ROS   GI/Hepatic negative GI ROS,   Endo/Other  diabetes  Renal/GU      Musculoskeletal   Abdominal   Peds  Hematology  (+) Blood dyscrasia, anemia , Hgb 11.3 Plts 174  Pt refuses blood Products   Anesthesia Other Findings   Reproductive/Obstetrics                            Anesthesia Physical  Anesthesia Plan  ASA: I  Anesthesia Plan: General   Post-op Pain Management:    Induction: Intravenous  PONV Risk Score and Plan: 4 or greater and Treatment may vary due to age or medical condition, Dexamethasone, Ondansetron, Midazolam and Diphenhydramine  Airway Management Planned: LMA  Additional Equipment: None  Intra-op Plan:   Post-operative Plan: Extubation in OR  Informed Consent: I have reviewed the patients History and Physical, chart, labs and discussed the procedure including the risks, benefits and alternatives for the proposed anesthesia with the patient or authorized representative who has indicated his/her understanding and acceptance.     Dental advisory given  Plan Discussed with: CRNA  Anesthesia Plan Comments:        Anesthesia Quick Evaluation

## 2020-08-01 ENCOUNTER — Encounter (HOSPITAL_COMMUNITY): Payer: Self-pay | Admitting: Obstetrics and Gynecology

## 2020-08-01 NOTE — Anesthesia Postprocedure Evaluation (Signed)
Anesthesia Post Note  Patient: Kayla Liu  Procedure(s) Performed: DILATATION AND EVACUATION (Vagina )     Patient location during evaluation: PACU Anesthesia Type: General Level of consciousness: awake and alert Pain management: pain level controlled Vital Signs Assessment: post-procedure vital signs reviewed and stable Respiratory status: spontaneous breathing, nonlabored ventilation, respiratory function stable and patient connected to nasal cannula oxygen Cardiovascular status: blood pressure returned to baseline and stable Postop Assessment: no apparent nausea or vomiting Anesthetic complications: no   No notable events documented.  Last Vitals:  Vitals:   07/31/20 1705 07/31/20 1720  BP: 97/60 (!) 100/59  Pulse: 63 60  Resp: 16 18  Temp:  37.3 C  SpO2: 100% 100%    Last Pain:  Vitals:   07/31/20 1705  TempSrc:   PainSc: 2                  Janalyn Higby S

## 2020-08-02 LAB — SURGICAL PATHOLOGY

## 2020-08-19 ENCOUNTER — Other Ambulatory Visit: Payer: Self-pay

## 2020-08-19 ENCOUNTER — Encounter: Payer: Medicaid Other | Admitting: Obstetrics and Gynecology

## 2020-08-27 ENCOUNTER — Other Ambulatory Visit: Payer: Self-pay

## 2020-08-27 ENCOUNTER — Other Ambulatory Visit (HOSPITAL_COMMUNITY)
Admission: RE | Admit: 2020-08-27 | Discharge: 2020-08-27 | Disposition: A | Discharge status: Home / Self Care | Payer: Medicaid Other | Source: Ambulatory Visit | Attending: Obstetrics and Gynecology | Admitting: Obstetrics and Gynecology

## 2020-08-27 ENCOUNTER — Ambulatory Visit (INDEPENDENT_AMBULATORY_CARE_PROVIDER_SITE_OTHER): Payer: Medicaid Other | Admitting: Obstetrics and Gynecology

## 2020-08-27 ENCOUNTER — Encounter: Payer: Self-pay | Admitting: Obstetrics and Gynecology

## 2020-08-27 VITALS — BP 114/75 | HR 77 | Wt 139.0 lb

## 2020-08-27 DIAGNOSIS — O039 Complete or unspecified spontaneous abortion without complication: Secondary | ICD-10-CM

## 2020-08-27 DIAGNOSIS — A609 Anogenital herpesviral infection, unspecified: Secondary | ICD-10-CM

## 2020-08-27 DIAGNOSIS — Z124 Encounter for screening for malignant neoplasm of cervix: Secondary | ICD-10-CM

## 2020-08-27 DIAGNOSIS — Z4889 Encounter for other specified surgical aftercare: Secondary | ICD-10-CM

## 2020-08-27 MED ORDER — VALACYCLOVIR HCL 500 MG PO TABS: 500.0000 mg | ORAL_TABLET | Freq: Every morning | ORAL | 2 refills | Status: DC

## 2020-08-27 NOTE — Progress Notes (Signed)
    Subjective:    Kayla Liu is a 25 y.o. female who presents to the clinic status post dilation and evacuation on 07/31/20. The patient is not having any pain.  Eating a regular diet without difficulty. Bowel movements are normal. No other significant postoperative concerns.  The following portions of the patient's history were reviewed and updated as appropriate: allergies, current medications, past family history, past medical history, past social history, past surgical history, and problem list..  Last pap smear was not found in chart.  Due to her age, pap was performed today.  Review of Systems Pertinent items are noted in HPI.   Objective:   BP 114/75   Pulse 77   Wt 139 lb (63 kg)   LMP 05/09/2020   Breastfeeding Unknown   BMI 29.05 kg/m  Constitutional:  Well-developed, well-nourished female in no acute distress.   Skin: Skin is warm and dry, no rash noted, not diaphoretic,no erythema, no pallor.  Cardiovascular: Normal heart rate noted  Respiratory: Effort and breath sounds normal, no problems with respiration noted  Abdomen: Soft, bowel sounds active, non-tender, no abnormal masses  Incision: Healing well, no drainage, no erythema, no hernia, no seroma, no swelling, no dehiscence, incision well approximated  Pelvic:    Normal external genitalia , cvx WNL, no bleeding, vagina WNL   Surgical pathology  Products of conception with chorionic villi Assessment:   Doing well postoperatively.  Operative findings again reviewed. Pathology report discussed.   Plan:   1. Continue any current medications. 2. Wound care discussed. 3. Activity restrictions:  protected intercourse until nexplanon placed 4. Anticipated return to work: now. 5. Pt desires nexplanon for contraception, will be placed in 2 weeks. 6.  Refill of valtrex sent 7.  Routine preventative health maintenance measures emphasized. Please refer to After Visit Summary for other counseling recommendations.     Lynnda Shields, MD, [place] Attending [place] for [facility], [place]

## 2020-08-27 NOTE — Progress Notes (Signed)
Pt presents for Post-Op  Dilation and Evacuation. Pt denies any bleeding notes bleeding stopped 08/14/20. Pt needs refill on Valtrex   CC: None

## 2020-08-28 LAB — CYTOLOGY - PAP: Diagnosis: NEGATIVE

## 2020-09-12 ENCOUNTER — Encounter: Payer: Self-pay | Admitting: Obstetrics

## 2020-09-12 ENCOUNTER — Other Ambulatory Visit: Payer: Self-pay

## 2020-09-12 ENCOUNTER — Ambulatory Visit (INDEPENDENT_AMBULATORY_CARE_PROVIDER_SITE_OTHER): Payer: Medicaid Other | Admitting: Obstetrics

## 2020-09-12 VITALS — BP 127/89 | HR 61 | Wt 131.0 lb

## 2020-09-12 DIAGNOSIS — Z30017 Encounter for initial prescription of implantable subdermal contraceptive: Secondary | ICD-10-CM | POA: Diagnosis not present

## 2020-09-12 DIAGNOSIS — Z01812 Encounter for preprocedural laboratory examination: Secondary | ICD-10-CM

## 2020-09-12 LAB — POCT URINE PREGNANCY: Preg Test, Ur: NEGATIVE

## 2020-09-12 MED ORDER — ETONOGESTREL 68 MG ~~LOC~~ IMPL
68.0000 mg | DRUG_IMPLANT | Freq: Once | SUBCUTANEOUS | Status: AC
  Administered 2020-09-12: 68 mg via SUBCUTANEOUS

## 2020-09-12 NOTE — Progress Notes (Signed)
Pt presents for Nexplanon insertion.  LMP: 09/05/20 Pt denies any unprotected intercourse.

## 2020-09-12 NOTE — Progress Notes (Signed)
Nexplanon Procedure Note   PRE-OP DIAGNOSIS: Desired Long-Acting, Reversible Contraception ( LARC ) POST-OP DIAGNOSIS: Same  PROCEDURE: Nexplanon  placement Performing Provider: Clenton Pare. Jodi Mourning MD  Patient education prior to procedure, explained risk, benefits of Nexplanon, reviewed alternative options. Patient reported understanding. Gave consent to continue with procedure.   PROCEDURE:  Pregnancy Text :  Negative Site (check):      left arm         Sterile Preparation:   Hibiclens-Alcoholx3 Lot # :  [id number] Expiration Date : 2024 JUL 03  Insertion site was selected 8 - 10 cm from medial epicondyle and marked along with guiding site using sterile marker. Procedure area was prepped and draped in a sterile fashion. 1% Lidocaine 1.5 ml given prior to procedure. Nexplanon  was inserted subcutaneously.Needle was removed from the insertion site. Nexplanon capsule was palpated by provider and patient to assure satisfactory placement. Dressing applied.  Followup: The patient tolerated the procedure well without complications.  Standard post-procedure care is explained and return precautions are given.  Shelly Bombard, MD 09/12/2020 3:17 PM

## 2020-09-26 ENCOUNTER — Ambulatory Visit: Payer: Medicaid Other | Admitting: Obstetrics

## 2021-10-06 ENCOUNTER — Ambulatory Visit: Payer: Medicaid Other | Admitting: Advanced Practice Midwife

## 2021-11-16 IMAGING — US US OB LIMITED
1 series · 8 of 8 positions shown · non-contrast
Comparison: none

[Series 1: us ob limited · 8 of 8 slices shown]
[im 1/8]
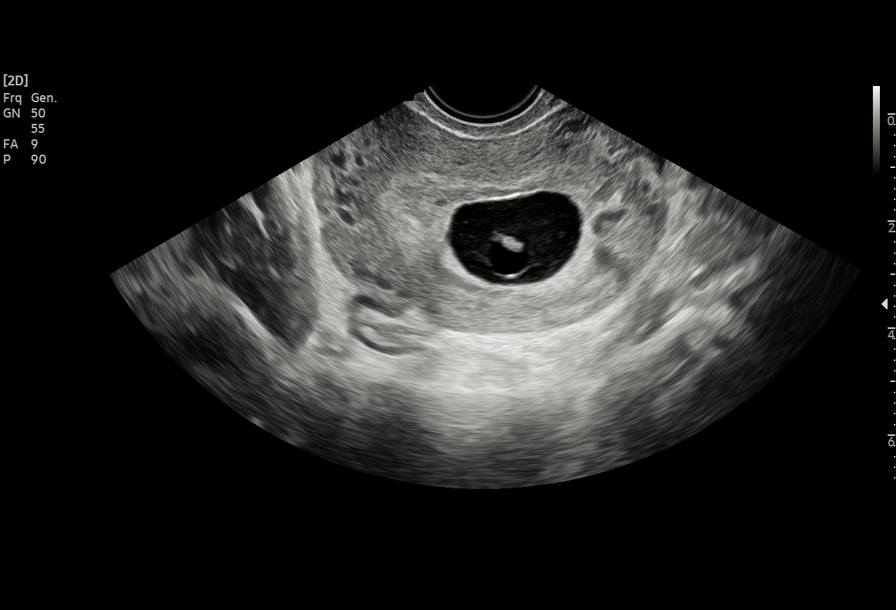
[im 2/8]
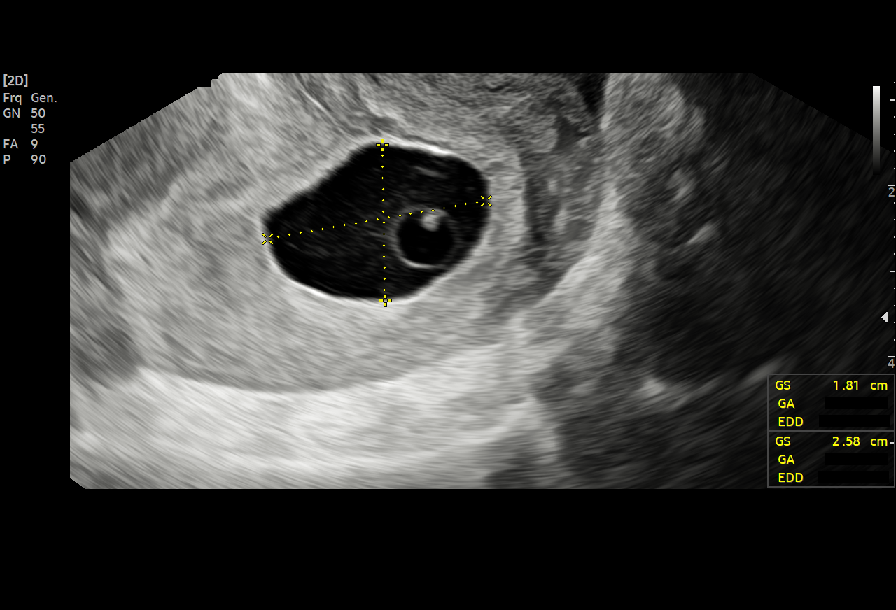
[im 3/8]
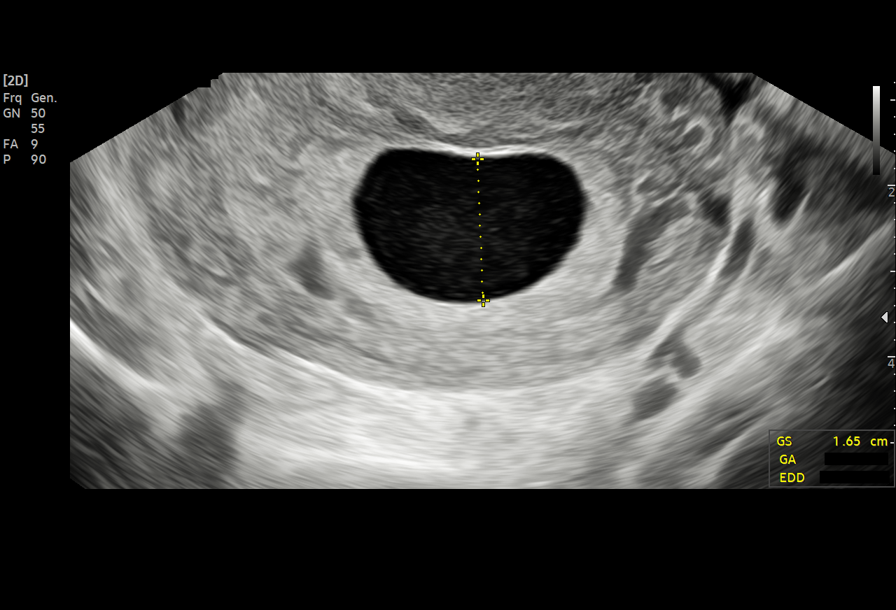
[im 4/8]
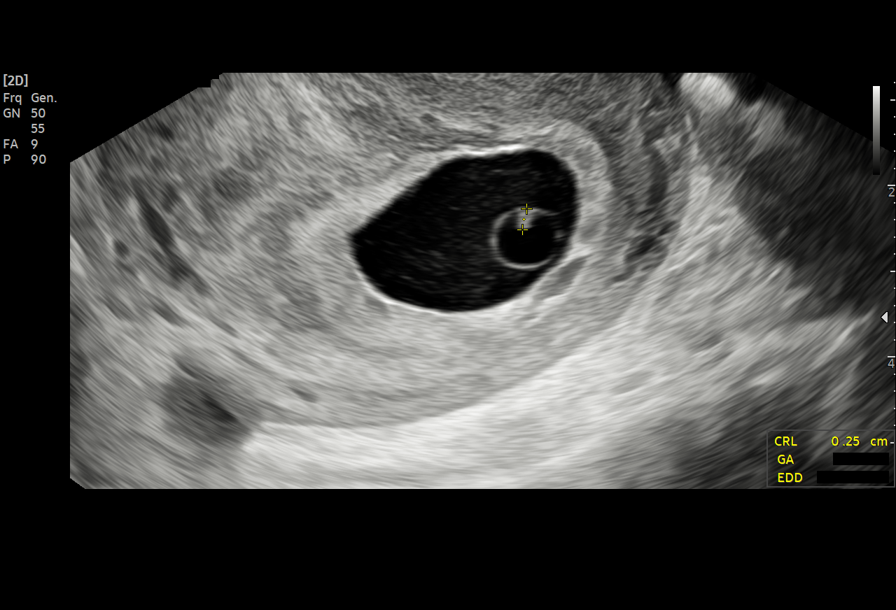
[im 5/8]
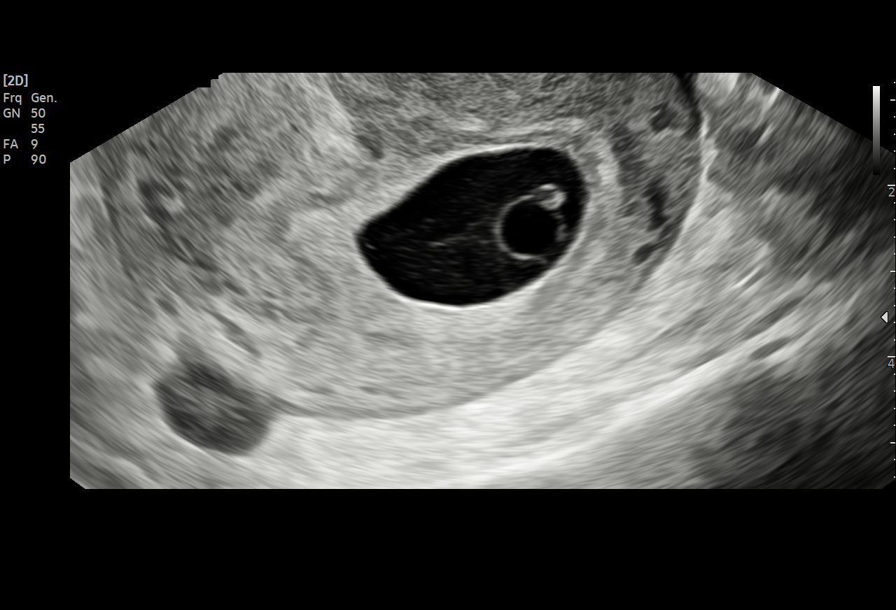
[im 6/8]
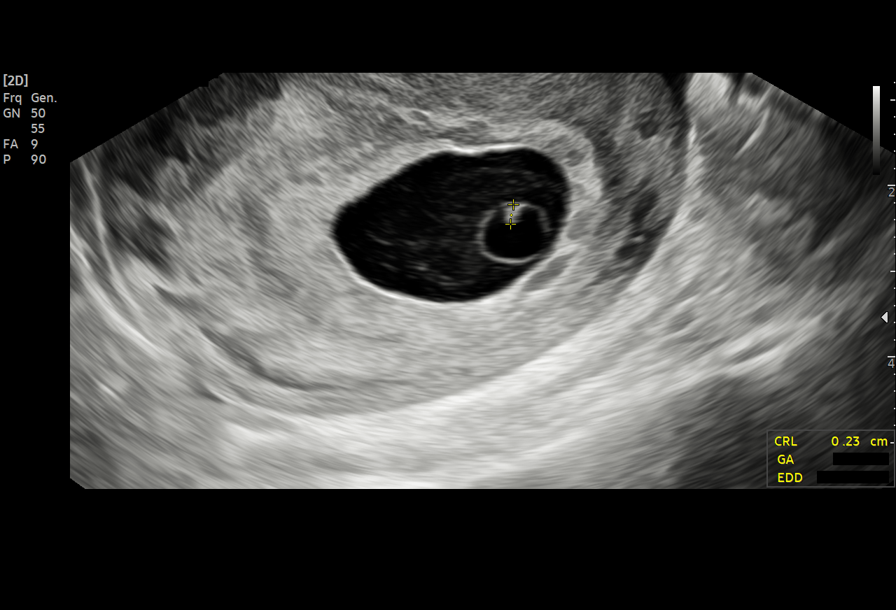
[im 7/8]
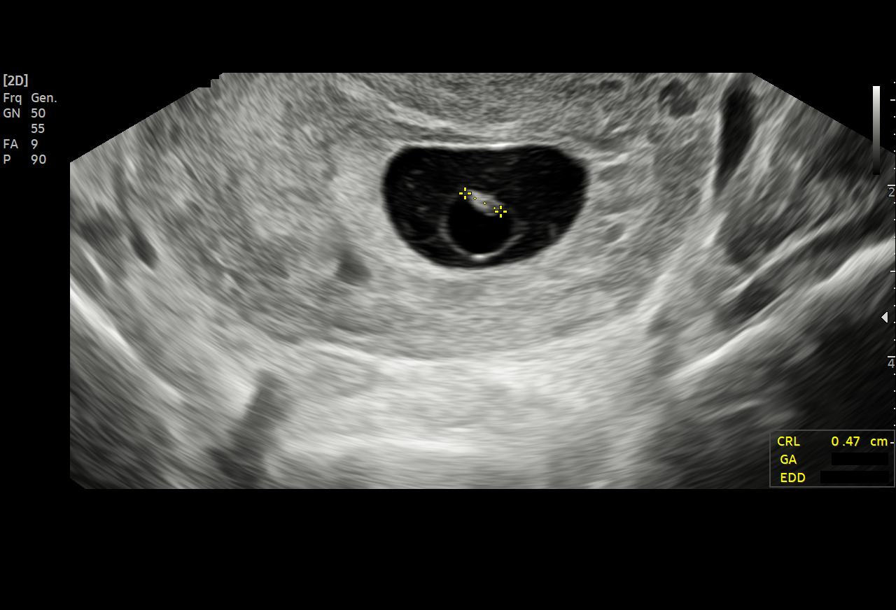
[im 8/8]
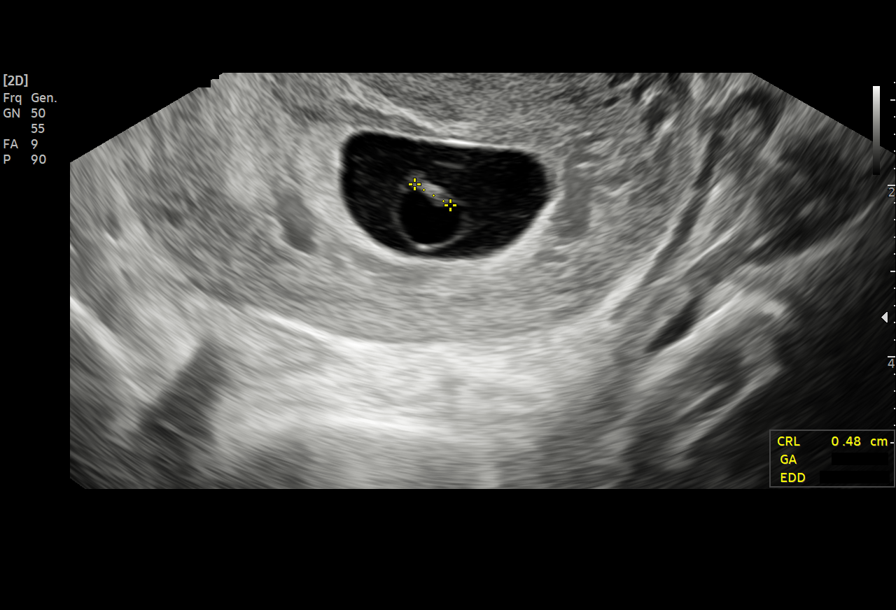

[8 of 8 positions shown; findings below may reference images not displayed]

[REDACTED]care

 1  [HOSPITAL]                         76815.0     AGBATOR MAMUS

Indications

 Less than 8 weeks gestation of pregnancy
 Pregnancy with inconclusive fetal viability
Fetal Evaluation

 Num Of Fetuses:         1
 Preg. Location:         Intrauterine
 Cardiac Activity:       Not visualized
Biometry

 GS:       20.1  mm     G. Age:  7w 1d                   EDD:   03/04/21
 CRL:       3.9  mm     G. Age:  6w 1d                   EDD:   03/11/21
Gestational Age

 Best:          6w 1d      Det. By:  U/S C R L (07/17/20)     EDD:   03/11/21
Comments

 IUP at about 6w1d by CRL. Dates are not consistent with
 LMP provided 05/07/20.  Technically limited exam due to early
 Gestational Age. Per JAEPOONG repeat scan in 10-14 days
Impression

 Single IUP at 6w1d by CRL
 Cardiac activity not noted
Recommendations

 Repeat U/S in 10-14 days to confrim viability

## 2021-11-28 IMAGING — US US OB < 14 WEEKS - US OB TV
1 series · 15 of 28 positions shown · non-contrast
Comparison: 07/14/2020

CLINICAL DATA: First trimester pregnancy, assessment of dating and
viability, LMP 05/09/2020

EXAM:
OBSTETRIC <14 WK US AND TRANSVAGINAL OB US
TECHNIQUE: Both transabdominal and transvaginal ultrasound examinations were
performed for complete evaluation of the gestation as well as the
maternal uterus, adnexal regions, and pelvic cul-de-sac.
Transvaginal technique was performed to assess early pregnancy.

[Series 1: us ob < 14 weeks - us ob tv · 15 of 94 slices shown]
[im 1/94]
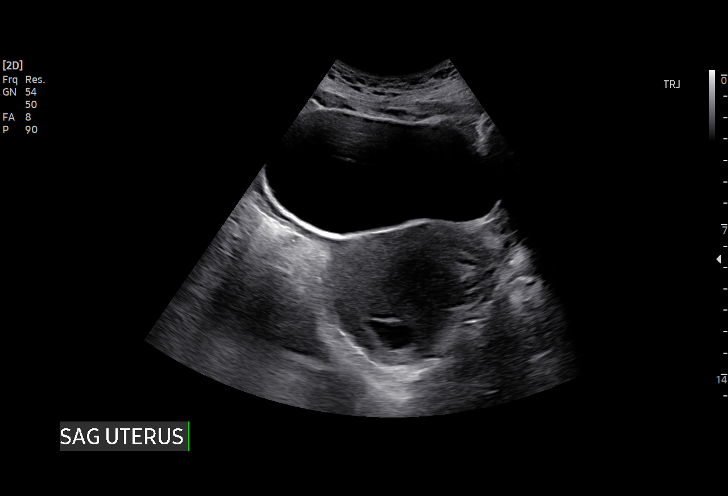
[im 7/94]
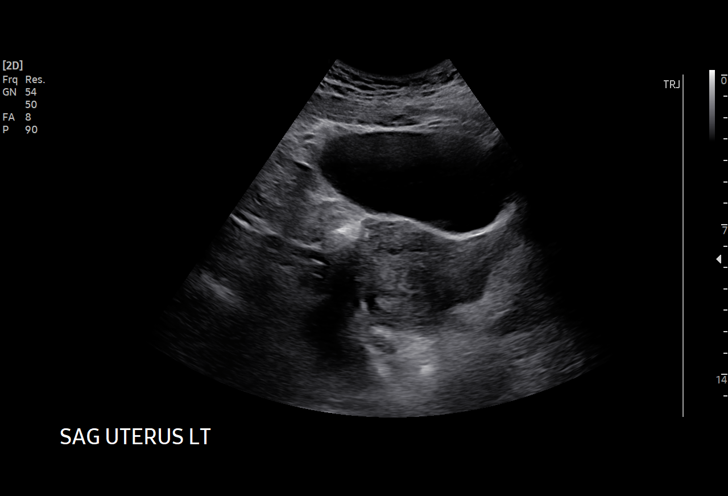
[im 14/94]
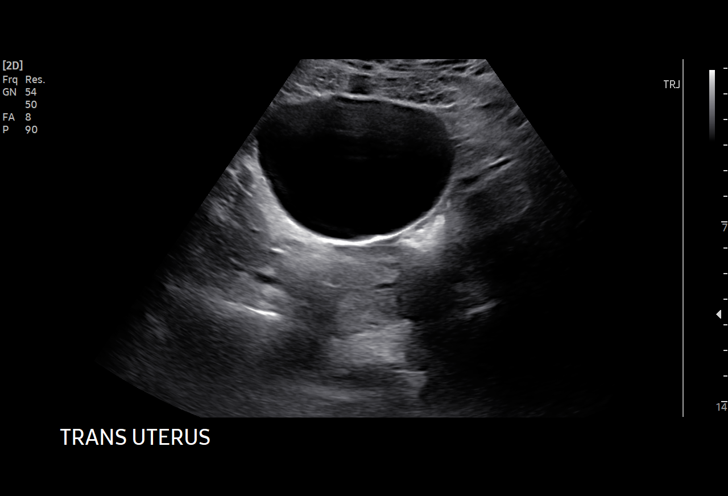
[im 21/94]
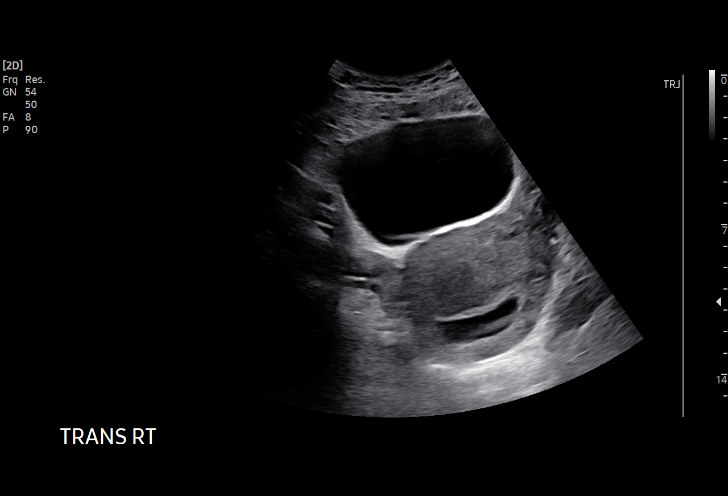
[im 28/94]
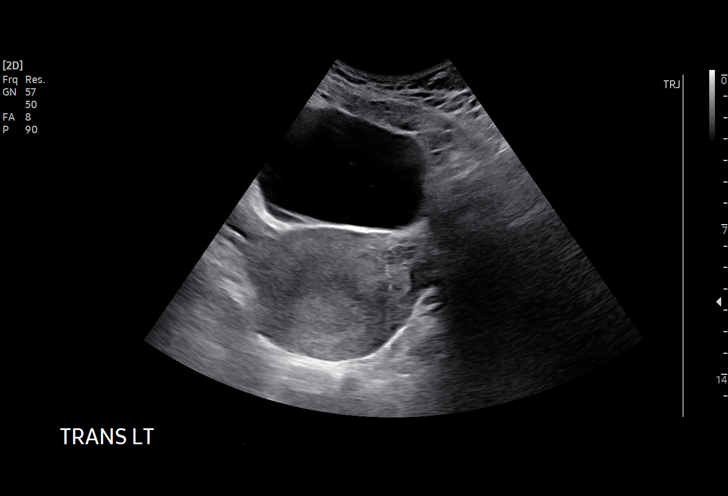
[im 35/94]
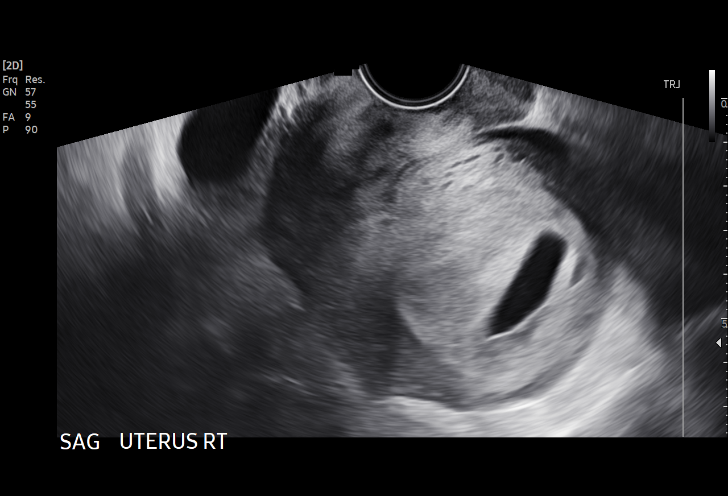
[im 42/94]
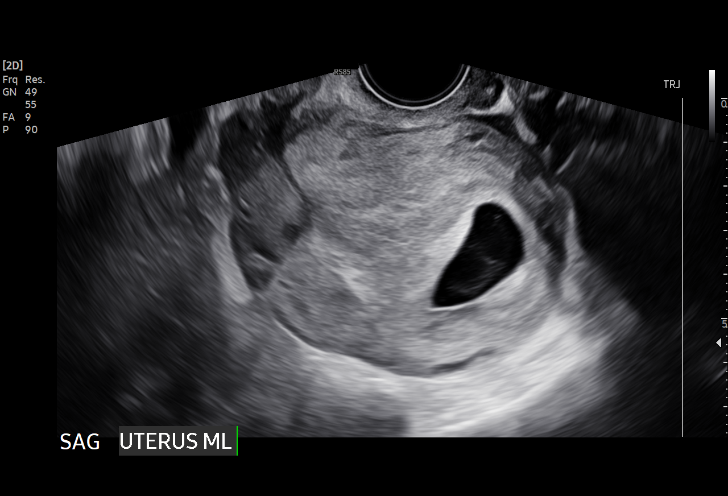
[im 49/94]
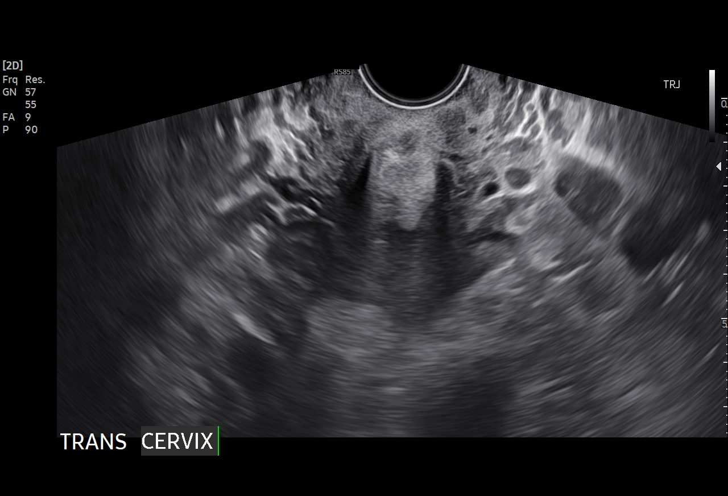
[im 52/94]
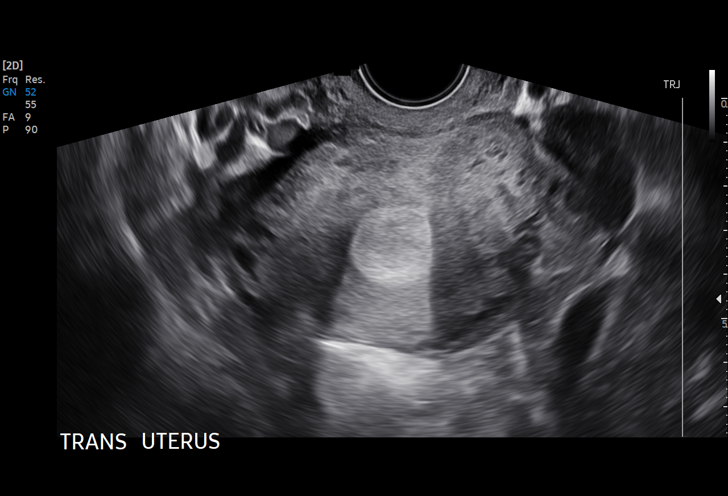
[im 59/94]
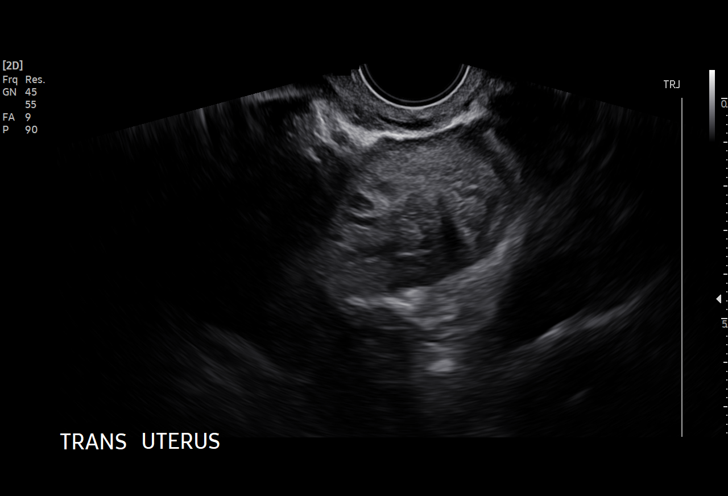
[im 66/94]
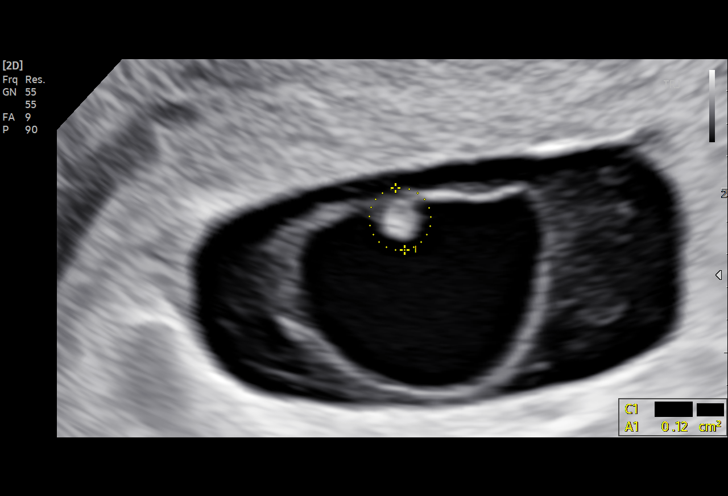
[im 73/94]
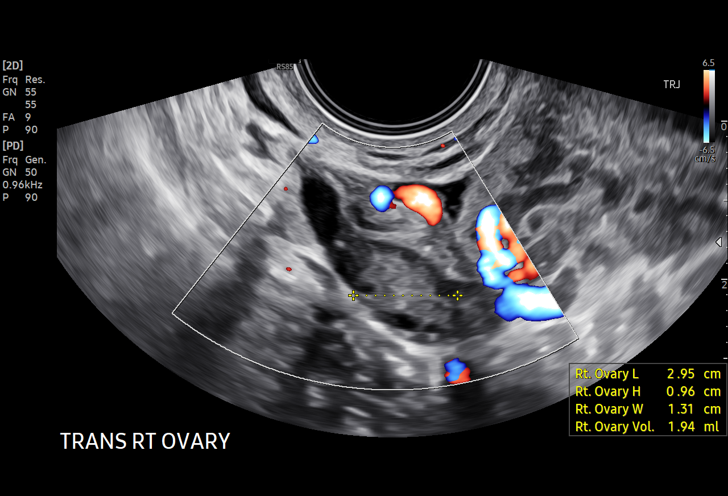
[im 80/94]
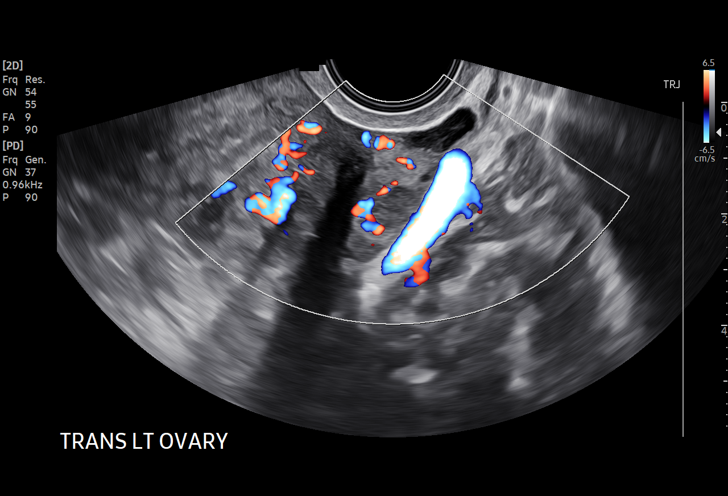
[im 87/94]
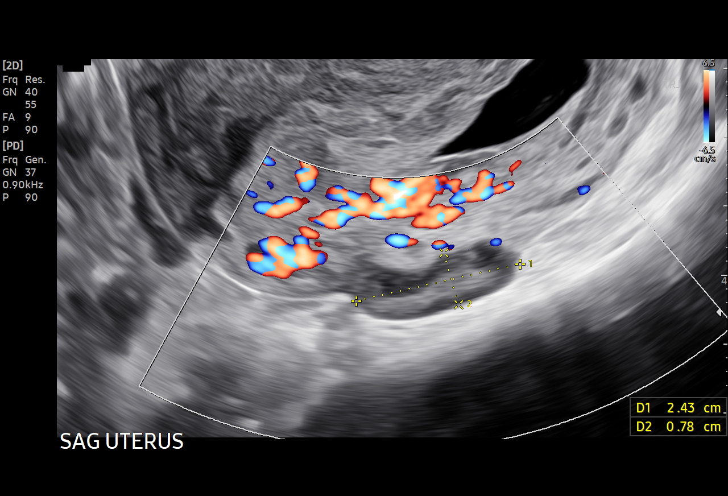
[im 94/94]
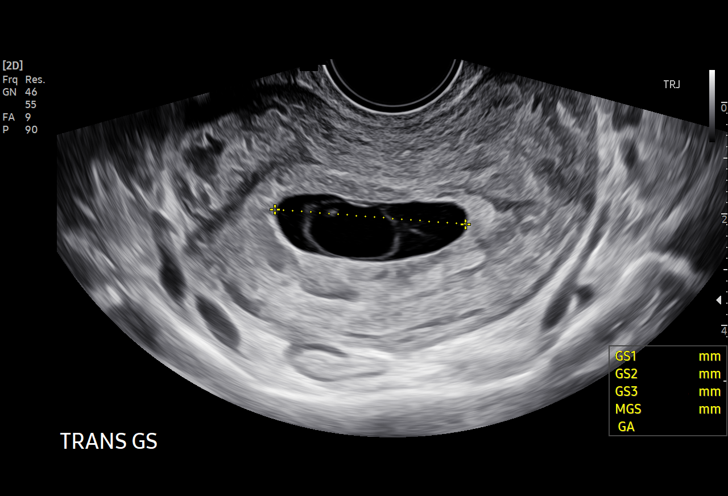

[15 of 28 positions shown; findings below may reference images not displayed]

FINDINGS: Intrauterine gestational sac: Present, single

Yolk sac:  Not identified

Embryo:  Present

Cardiac Activity: Not identified

Heart Rate: N/A  bpm

MSD: 27.8 mm   7 w   6 d

CRL:  3.9 mm   6 w   1 d                  US EDC: 03/23/2021

Subchorionic hemorrhage:  None visualized.

Maternal uterus/adnexae:

Maternal uterus retroverted with a small subserosal anterior
leiomyoma 2.4 x 2.0 x 0.8 cm.

RIGHT ovary normal size and morphology 3.0 x 1.0 x 1.3 cm.

LEFT ovary measures 3.3 x 1.7 x 1.4 cm and contains a small probable
corpus luteum.

Trace free pelvic fluid.

No adnexal masses.
IMPRESSION: Gestational sacs identified within the uterus containing a fetal
pole.

No fetal cardiac activity is identified.

In addition, fetal pole has not increased in size since the previous
exam.

Findings are suspicious but not yet definitive for failed pregnancy.
Recommend follow-up US in 10-14 days for definitive diagnosis. This
recommendation follows SRU consensus guidelines: Diagnostic Criteria
for Nonviable Pregnancy Early in the First Trimester. N Engl J Med

## 2022-06-16 ENCOUNTER — Encounter: Payer: Self-pay | Admitting: Obstetrics and Gynecology

## 2022-06-22 ENCOUNTER — Encounter: Payer: Self-pay | Admitting: Obstetrics and Gynecology

## 2022-06-22 ENCOUNTER — Ambulatory Visit (INDEPENDENT_AMBULATORY_CARE_PROVIDER_SITE_OTHER): Payer: Medicaid Other | Admitting: Obstetrics and Gynecology

## 2022-06-22 ENCOUNTER — Other Ambulatory Visit (HOSPITAL_COMMUNITY)
Admission: RE | Admit: 2022-06-22 | Discharge: 2022-06-22 | Disposition: A | Discharge status: Home / Self Care | Payer: Medicaid Other | Source: Ambulatory Visit | Attending: Obstetrics and Gynecology | Admitting: Obstetrics and Gynecology

## 2022-06-22 VITALS — BP 105/71 | HR 57 | Ht 59.0 in | Wt 158.0 lb

## 2022-06-22 DIAGNOSIS — A609 Anogenital herpesviral infection, unspecified: Secondary | ICD-10-CM | POA: Diagnosis not present

## 2022-06-22 DIAGNOSIS — Z202 Contact with and (suspected) exposure to infections with a predominantly sexual mode of transmission: Secondary | ICD-10-CM | POA: Insufficient documentation

## 2022-06-22 DIAGNOSIS — Z30017 Encounter for initial prescription of implantable subdermal contraceptive: Secondary | ICD-10-CM | POA: Insufficient documentation

## 2022-06-22 DIAGNOSIS — N921 Excessive and frequent menstruation with irregular cycle: Secondary | ICD-10-CM

## 2022-06-22 DIAGNOSIS — Z975 Presence of (intrauterine) contraceptive device: Secondary | ICD-10-CM | POA: Diagnosis not present

## 2022-06-22 LAB — POCT URINE PREGNANCY: Preg Test, Ur: NEGATIVE

## 2022-06-22 MED ORDER — VALACYCLOVIR HCL 500 MG PO TABS: 500.0000 mg | ORAL_TABLET | Freq: Every morning | ORAL | 6 refills | Status: DC

## 2022-06-22 NOTE — Progress Notes (Signed)
Ms Pala presents with c/o irregular bleeding in the setting of Nexplanon. Nexplanon placed 7/22. She has had some irregular bleeding since insertion. However she had 2 cycles last month. She just wanted to be sure this was normal. She also desires SDT testing   PE AF VSS Chaperone present  Lungs clear Heart RRR Abd soft + BS GU Nl EGBUS, nl vaginal discharge, cervix no lesion, uterus small, mobile, no masses  A/P BTB with Nexplanon        Desire for STD testing  Pt reassured in regards to her bleeding pattern. STD testing as per pt request. F/U per test results.

## 2022-06-22 NOTE — Progress Notes (Addendum)
27 y.o. GYN presents for Irregular periods, c/o having a period twice per month, 4/10-18/24 and 4/20-28/24, cramps 8/10 for 1st four days of period vaginal discharge and odor.   Pt requested UPT, she is on Nexplanon.  Last PAP 08/27/2020.

## 2022-06-23 LAB — HEPATITIS B SURFACE ANTIGEN: Hepatitis B Surface Ag: NEGATIVE

## 2022-06-23 LAB — HEPATITIS C ANTIBODY: Hep C Virus Ab: NONREACTIVE

## 2022-06-23 LAB — CERVICOVAGINAL ANCILLARY ONLY
Bacterial Vaginitis (gardnerella): POSITIVE — AB
Candida Glabrata: NEGATIVE
Candida Vaginitis: NEGATIVE
Chlamydia: NEGATIVE
Comment: NEGATIVE
Comment: NEGATIVE
Comment: NEGATIVE
Comment: NEGATIVE
Comment: NEGATIVE
Comment: NORMAL
Neisseria Gonorrhea: NEGATIVE
Trichomonas: NEGATIVE

## 2022-06-23 LAB — HIV ANTIBODY (ROUTINE TESTING W REFLEX): HIV Screen 4th Generation wRfx: NONREACTIVE

## 2022-06-23 LAB — RPR: RPR Ser Ql: NONREACTIVE

## 2022-06-24 ENCOUNTER — Encounter: Payer: Self-pay | Admitting: Obstetrics and Gynecology

## 2022-06-24 ENCOUNTER — Other Ambulatory Visit: Payer: Self-pay

## 2022-06-24 DIAGNOSIS — B9689 Other specified bacterial agents as the cause of diseases classified elsewhere: Secondary | ICD-10-CM

## 2022-06-24 MED ORDER — METRONIDAZOLE 500 MG PO TABS: 500.0000 mg | ORAL_TABLET | Freq: Two times a day (BID) | ORAL | 0 refills | Status: DC

## 2022-09-10 ENCOUNTER — Ambulatory Visit: Payer: Medicaid Other | Admitting: Obstetrics and Gynecology

## 2023-02-06 ENCOUNTER — Encounter: Payer: Self-pay | Admitting: Obstetrics

## 2024-01-06 ENCOUNTER — Other Ambulatory Visit (HOSPITAL_COMMUNITY)
Admission: RE | Admit: 2024-01-06 | Discharge: 2024-01-06 | Disposition: A | Discharge status: Home / Self Care | Payer: MEDICAID | Source: Ambulatory Visit | Attending: Obstetrics and Gynecology | Admitting: Obstetrics and Gynecology

## 2024-01-06 ENCOUNTER — Encounter: Payer: Self-pay | Admitting: Obstetrics and Gynecology

## 2024-01-06 ENCOUNTER — Ambulatory Visit: Payer: MEDICAID | Admitting: Obstetrics and Gynecology

## 2024-01-06 VITALS — BP 120/77 | HR 71 | Ht <= 58 in | Wt 147.0 lb

## 2024-01-06 DIAGNOSIS — Z30019 Encounter for initial prescription of contraceptives, unspecified: Secondary | ICD-10-CM

## 2024-01-06 DIAGNOSIS — Z113 Encounter for screening for infections with a predominantly sexual mode of transmission: Secondary | ICD-10-CM | POA: Insufficient documentation

## 2024-01-06 DIAGNOSIS — Z01419 Encounter for gynecological examination (general) (routine) without abnormal findings: Secondary | ICD-10-CM | POA: Insufficient documentation

## 2024-01-06 DIAGNOSIS — Z3046 Encounter for surveillance of implantable subdermal contraceptive: Secondary | ICD-10-CM

## 2024-01-06 DIAGNOSIS — A609 Anogenital herpesviral infection, unspecified: Secondary | ICD-10-CM

## 2024-01-06 MED ORDER — VALACYCLOVIR HCL 500 MG PO TABS: 500.0000 mg | ORAL_TABLET | Freq: Every morning | ORAL | 8 refills | Status: AC

## 2024-01-06 MED ORDER — NORELGESTROMIN-ETH ESTRADIOL 150-35 MCG/24HR TD PTWK: 1.0000 | MEDICATED_PATCH | TRANSDERMAL | 11 refills | Status: AC

## 2024-01-06 NOTE — Progress Notes (Signed)
 GYNECOLOGY ANNUAL PREVENTATIVE CARE ENCOUNTER NOTE  History:     Kayla Liu is a 28 y.o. G37P1001 female here for a routine annual gynecologic exam.  Current complaints: desires nexplanon  removal.   Denies abnormal vaginal bleeding, discharge, pelvic pain, problems with intercourse or other gynecologic concerns.    Gynecologic History No LMP recorded. (Menstrual status: Other). Contraception: Ortho-Evra patches weekly Last Pap: 7/22. Results were: normal with negative HPV Last mammogram: n/a  Obstetric History OB History  Gravida Para Term Preterm AB Living  2 1 1   1   SAB IAB Ectopic Multiple Live Births     0 1    # Outcome Date GA Lbr Len/2nd Weight Sex Type Anes PTL Lv  2 Gravida           1 Term 04/23/18 [redacted]w[redacted]d  5 lb 14 oz (2.665 kg) M CS-LTranv Spinal  LIV     Birth Comments: Term female infant with small sacral dimple    Past Medical History:  Diagnosis Date   HSV (herpes simplex virus) infection 2019    Past Surgical History:  Procedure Laterality Date   CESAREAN SECTION N/A 04/23/2018   Procedure: CESAREAN SECTION;  Surgeon: Izell Harari, MD;  Location: [facility] LD ORS;  Service: Obstetrics;  Laterality: N/A;   DILATION AND EVACUATION N/A 07/31/2020   Procedure: DILATATION AND EVACUATION;  Surgeon: Lorence Ozell CROME, MD;  Location: [facility] OR;  Service: Gynecology;  Laterality: N/A;    Current Outpatient Medications on File Prior to Visit  Medication Sig Dispense Refill   metroNIDAZOLE  (FLAGYL ) 500 MG tablet Take 1 tablet (500 mg total) by mouth 2 (two) times daily. 14 tablet 0   No current facility-administered medications on file prior to visit.    No Known Allergies  Social History:  reports that she quit smoking about 3 years ago. Her smoking use included cigarettes. She started smoking about 4 years ago. She uses smokeless tobacco. She reports current alcohol use. She reports current drug use. Drug: Marijuana.  Family History  Problem Relation Age of Onset    Hypertension Maternal Aunt    Healthy Mother    Healthy Father     The following portions of the patient's history were reviewed and updated as appropriate: allergies, current medications, past family history, past medical history, past social history, past surgical history and problem list.  Review of Systems Pertinent items noted in HPI and remainder of comprehensive ROS otherwise negative.  Physical Exam:  BP 120/77   Pulse 71   Ht 4' 10 (1.473 m)   Wt 147 lb (66.7 kg)   BMI 30.72 kg/m  CONSTITUTIONAL: Well-developed, well-nourished female in no acute distress.  HENT:  Normocephalic, atraumatic, External right and left ear normal. Oropharynx is clear and moist EYES: Conjunctivae and EOM are normal.  NECK: Normal range of motion, supple, no masses.  Normal thyroid.  SKIN: Skin is warm and dry. No rash noted. Not diaphoretic. No erythema. No pallor. MUSCULOSKELETAL: Normal range of motion. No tenderness.  No cyanosis, clubbing, or edema.  2+ distal pulses. NEUROLOGIC: Alert and oriented to person, place, and time. Normal reflexes, muscle tone coordination.  PSYCHIATRIC: Normal mood and affect. Normal behavior. Normal judgment and thought content. CARDIOVASCULAR: Normal heart rate noted, regular rhythm RESPIRATORY: Clear to auscultation bilaterally. Effort and breath sounds normal, no problems with respiration noted. BREASTS: Symmetric in size. No masses, tenderness, skin changes, nipple drainage, or lymphadenopathy bilaterally. Performed in the presence of a chaperone. ABDOMEN: Soft, no  distention noted.  No tenderness, rebound or guarding. Small c section scar noted PELVIC: Normal appearing external genitalia and urethral meatus; normal appearing vaginal mucosa and cervix.  No abnormal discharge noted.  Pap smear obtained.  Vaginal swab obtained.  Normal uterine size, no other palpable masses, no uterine or adnexal tenderness.  Performed in the presence of a chaperone.    Assessment and Plan:    1. HSV (herpes simplex virus) anogenital infection Refill for continued therapy - valACYclovir  (VALTREX ) 500 MG tablet; Take 1 tablet (500 mg total) by mouth in the morning.  Dispense: 30 tablet; Refill: 8  2. Women's annual routine gynecological examination (Primary) Normal annual exam - Cytology - PAP( [place])  3. Routine screening for STI (sexually transmitted infection) Per pt request - Cervicovaginal ancillary only( [place]) - HIV antibody (with reflex) - Hepatitis C Antibody - Hepatitis B Surface AntiGEN - [facility]  4. Encounter for Nexplanon  removal See separate note  5. Encounter for female birth control Long discussion of options.  Pt does not want any implanted devices, but was concerned about remembering to take her meds.  Discussed vaginal ring or contraceptive patches as a compromise.  Pt desires contraceptive patch.  Rx sent.   - norelgestromin-ethinyl estradiol (XULANE) 150-35 MCG/24HR transdermal patch; Place 1 patch onto the skin once a week.  Dispense: 3 patch; Refill: 11  Will follow up results of pap smear and manage accordingly. F/u in 1 year or prn Routine preventative health maintenance measures emphasized. Please refer to After Visit Summary for other counseling recommendations.      Jerilynn Buddle, MD, FACOG Obstetrician & Gynecologist, [facility] for [facility], [facility]

## 2024-01-06 NOTE — Progress Notes (Addendum)
 28 y.o. GYN presents for AEX/STD screening/Nexplanon  removal.

## 2024-01-06 NOTE — Progress Notes (Signed)
     GYNECOLOGY OFFICE PROCEDURE NOTE  Kayla Liu is a 28 y.o. G2P1001 here for Nexplanon  removal.  Last pap smear was on 7/22 and was normal.  No other gynecologic concerns.   Nexplanon  Removal Patient identified, informed consent performed, consent signed.   Appropriate time out taken. Nexplanon  site identified.  Area prepped in usual sterile fashon. One ml of 1% lidocaine  was used to anesthetize the area at the distal end of the implant. A small stab incision was made right beside the implant on the distal portion.  The Nexplanon  rod was grasped using hemostats and removed without difficulty.  There was minimal blood loss. There were no complications.  3 ml of 1% lidocaine  was injected around the incision for post-procedure analgesia.  Steri-strips were applied over the small incision.  A pressure bandage was applied to reduce any bruising.  The patient tolerated the procedure well and was given post procedure instructions.  Patient is planning to use contraceptive patches for contraception.     Jerilynn Buddle, MD, FACOG Obstetrician & Gynecologist, [facility] for [facility], [facility]

## 2024-01-07 ENCOUNTER — Encounter: Payer: Self-pay | Admitting: Obstetrics and Gynecology

## 2024-01-07 LAB — CERVICOVAGINAL ANCILLARY ONLY
Bacterial Vaginitis (gardnerella): POSITIVE — AB
Candida Glabrata: NEGATIVE
Candida Vaginitis: NEGATIVE
Chlamydia: NEGATIVE
Comment: NEGATIVE
Comment: NEGATIVE
Comment: NEGATIVE
Comment: NEGATIVE
Comment: NEGATIVE
Comment: NORMAL
Neisseria Gonorrhea: NEGATIVE
Trichomonas: NEGATIVE

## 2024-01-07 LAB — RPR: RPR Ser Ql: NONREACTIVE

## 2024-01-07 LAB — HEPATITIS B SURFACE ANTIGEN: Hepatitis B Surface Ag: NEGATIVE

## 2024-01-07 LAB — HEPATITIS C ANTIBODY: Hep C Virus Ab: NONREACTIVE

## 2024-01-07 LAB — HIV ANTIBODY (ROUTINE TESTING W REFLEX): HIV Screen 4th Generation wRfx: NONREACTIVE

## 2024-01-08 ENCOUNTER — Encounter: Payer: Self-pay | Admitting: Obstetrics and Gynecology

## 2024-01-10 ENCOUNTER — Other Ambulatory Visit: Payer: Self-pay

## 2024-01-10 DIAGNOSIS — Z30019 Encounter for initial prescription of contraceptives, unspecified: Secondary | ICD-10-CM

## 2024-01-10 DIAGNOSIS — A609 Anogenital herpesviral infection, unspecified: Secondary | ICD-10-CM

## 2024-01-10 DIAGNOSIS — B9689 Other specified bacterial agents as the cause of diseases classified elsewhere: Secondary | ICD-10-CM

## 2024-01-10 LAB — CYTOLOGY - PAP: Diagnosis: NEGATIVE

## 2024-01-10 MED ORDER — METRONIDAZOLE 500 MG PO TABS: 500.0000 mg | ORAL_TABLET | Freq: Two times a day (BID) | ORAL | 0 refills | Status: AC

## 2024-01-12 ENCOUNTER — Ambulatory Visit: Payer: Self-pay | Admitting: Obstetrics and Gynecology

## 2024-02-29 ENCOUNTER — Encounter: Payer: MEDICAID | Admitting: Obstetrics and Gynecology
